# Patient Record
Sex: Male | Born: 1955 | Race: Black or African American | Hispanic: No | Marital: Married | Smoking: Never smoker
Health system: Southern US, Community
[De-identification: ages and names within clinical notes are randomized; demographics above are authoritative.]

## PROBLEM LIST (undated history)

## (undated) DIAGNOSIS — M1990 Unspecified osteoarthritis, unspecified site: Secondary | ICD-10-CM

## (undated) DIAGNOSIS — T7840XA Allergy, unspecified, initial encounter: Secondary | ICD-10-CM

## (undated) DIAGNOSIS — I10 Essential (primary) hypertension: Secondary | ICD-10-CM

## (undated) DIAGNOSIS — E7800 Pure hypercholesterolemia, unspecified: Secondary | ICD-10-CM

## (undated) HISTORY — DX: Unspecified osteoarthritis, unspecified site: M19.90

## (undated) HISTORY — PX: LIPOMA EXCISION: SHX5283

## (undated) HISTORY — DX: Allergy, unspecified, initial encounter: T78.40XA

---

## 1982-01-16 HISTORY — PX: KNEE ARTHROSCOPY: SHX127

## 2005-04-05 ENCOUNTER — Emergency Department (HOSPITAL_COMMUNITY): Admission: EM | Admit: 2005-04-05 | Discharge: 2005-04-05 | Payer: Self-pay | Admitting: *Deleted

## 2009-04-19 ENCOUNTER — Encounter (INDEPENDENT_AMBULATORY_CARE_PROVIDER_SITE_OTHER): Payer: Self-pay | Admitting: *Deleted

## 2010-02-17 NOTE — Letter (Signed)
Summary: Pre Visit No Show Letter  [facility]  [address] [place], [place] [address]   Phone: [phone]  Fax: [phone]        April 19, 2009 MRN: [id number]    Sean Keller [address] [place], [place]  [address]    Dear [name],   We have been unable to reach you by phone concerning the pre-procedure visit that you missed on 04/19/2009. For this reason,your procedure scheduled on 05/03/2009 has been cancelled. Our scheduling staff will gladly assist you with rescheduling your appointments at a more convenient time. Please call our office at [phone] between the hours of 8:00am and 5:00pm, press option #2 to reach an appointment scheduler. Please consider updating your contact numbers at this time so that we can reach you by phone in the future with schedule changes or results.    Thank you,    Ezra Sites RN [facility]

## 2010-11-29 ENCOUNTER — Other Ambulatory Visit: Payer: Self-pay | Admitting: Family Medicine

## 2010-11-29 DIAGNOSIS — R52 Pain, unspecified: Secondary | ICD-10-CM

## 2010-12-01 ENCOUNTER — Ambulatory Visit
Admission: RE | Admit: 2010-12-01 | Discharge: 2010-12-01 | Disposition: A | Discharge status: Home / Self Care | Payer: BC Managed Care – PPO | Source: Ambulatory Visit | Attending: Family Medicine | Admitting: Family Medicine

## 2010-12-01 DIAGNOSIS — R52 Pain, unspecified: Secondary | ICD-10-CM

## 2011-03-03 ENCOUNTER — Ambulatory Visit (HOSPITAL_COMMUNITY): Payer: BC Managed Care – PPO | Admitting: Radiology

## 2011-03-03 ENCOUNTER — Other Ambulatory Visit: Payer: Self-pay | Admitting: Family Medicine

## 2011-03-03 MED ORDER — PRAVASTATIN SODIUM 20 MG PO TABS: 20.0000 mg | ORAL_TABLET | Freq: Every day | ORAL | Status: DC

## 2011-06-15 ENCOUNTER — Other Ambulatory Visit: Payer: Self-pay | Admitting: Family Medicine

## 2011-07-17 DIAGNOSIS — Z0271 Encounter for disability determination: Secondary | ICD-10-CM

## 2011-08-08 ENCOUNTER — Encounter (HOSPITAL_COMMUNITY): Payer: Self-pay | Admitting: *Deleted

## 2011-08-08 ENCOUNTER — Emergency Department (HOSPITAL_COMMUNITY): Payer: Worker's Compensation

## 2011-08-08 ENCOUNTER — Emergency Department (HOSPITAL_COMMUNITY)
Admission: EM | Admit: 2011-08-08 | Discharge: 2011-08-08 | Disposition: A | Discharge status: Home / Self Care | Payer: Worker's Compensation | Attending: Emergency Medicine | Admitting: Emergency Medicine

## 2011-08-08 DIAGNOSIS — Y92410 Unspecified street and highway as the place of occurrence of the external cause: Secondary | ICD-10-CM | POA: Insufficient documentation

## 2011-08-08 DIAGNOSIS — R51 Headache: Secondary | ICD-10-CM | POA: Insufficient documentation

## 2011-08-08 DIAGNOSIS — M542 Cervicalgia: Secondary | ICD-10-CM | POA: Insufficient documentation

## 2011-08-08 DIAGNOSIS — S134XXA Sprain of ligaments of cervical spine, initial encounter: Secondary | ICD-10-CM

## 2011-08-08 DIAGNOSIS — E7800 Pure hypercholesterolemia, unspecified: Secondary | ICD-10-CM | POA: Insufficient documentation

## 2011-08-08 HISTORY — DX: Pure hypercholesterolemia, unspecified: E78.00

## 2011-08-08 MED ORDER — METHOCARBAMOL 500 MG PO TABS: 500.0000 mg | ORAL_TABLET | Freq: Four times a day (QID) | ORAL | Status: AC | PRN

## 2011-08-08 MED ORDER — NAPROXEN 375 MG PO TABS: 375.0000 mg | ORAL_TABLET | Freq: Two times a day (BID) | ORAL | Status: AC
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
## 2011-08-10 NOTE — ED Provider Notes (Signed)
Medical screening examination/treatment/procedure(s) were performed by non-physician practitioner and as supervising physician I was immediately available for consultation/collaboration.  [name], MD 08/10/11 1038 

## 2011-08-12 ENCOUNTER — Ambulatory Visit (INDEPENDENT_AMBULATORY_CARE_PROVIDER_SITE_OTHER): Payer: BC Managed Care – PPO | Admitting: Emergency Medicine

## 2011-08-12 VITALS — BP 136/82 | HR 73 | Temp 98.3°F | Resp 20 | Ht 69.0 in | Wt 230.0 lb

## 2011-08-12 DIAGNOSIS — S161XXA Strain of muscle, fascia and tendon at neck level, initial encounter: Secondary | ICD-10-CM

## 2011-08-12 DIAGNOSIS — S139XXA Sprain of joints and ligaments of unspecified parts of neck, initial encounter: Secondary | ICD-10-CM

## 2011-08-12 MED ORDER — HYDROCODONE-ACETAMINOPHEN 5-500 MG PO TABS: 1.0000 | ORAL_TABLET | ORAL | Status: AC | PRN

## 2011-08-12 NOTE — Patient Instructions (Addendum)
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
## 2011-11-29 ENCOUNTER — Other Ambulatory Visit: Payer: Self-pay | Admitting: Family Medicine

## 2012-01-08 ENCOUNTER — Ambulatory Visit (INDEPENDENT_AMBULATORY_CARE_PROVIDER_SITE_OTHER): Payer: BC Managed Care – PPO | Admitting: Emergency Medicine

## 2012-01-08 VITALS — BP 133/82 | HR 68 | Temp 98.0°F | Resp 17 | Ht 69.5 in | Wt 231.0 lb

## 2012-01-08 DIAGNOSIS — S335XXA Sprain of ligaments of lumbar spine, initial encounter: Secondary | ICD-10-CM

## 2012-01-08 DIAGNOSIS — Z0000 Encounter for general adult medical examination without abnormal findings: Secondary | ICD-10-CM

## 2012-01-08 DIAGNOSIS — Z23 Encounter for immunization: Secondary | ICD-10-CM

## 2012-01-08 LAB — TESTOSTERONE: Testosterone: 194.98 ng/dL — ABNORMAL LOW (ref 300–890)

## 2012-01-08 LAB — POCT CBC
Lymph, poc: 2.6 (ref 0.6–3.4)
MCH, POC: 28.1 pg (ref 27–31.2)
MCV: 91.7 fL (ref 80–97)
MPV: 8.4 fL (ref 0–99.8)
POC LYMPH PERCENT: 31.9 %L (ref 10–50)
Platelet Count, POC: 301 10*3/uL (ref 142–424)
RBC: 5.31 M/uL (ref 4.69–6.13)

## 2012-01-08 LAB — LIPID PANEL
Cholesterol: 138 mg/dL (ref 0–200)
HDL: 36 mg/dL — ABNORMAL LOW (ref >39)
LDL Cholesterol: 82 mg/dL (ref 0–99)
Total CHOL/HDL Ratio: 3.8 Ratio
Triglycerides: 99 mg/dL (ref <150)

## 2012-01-08 LAB — COMPREHENSIVE METABOLIC PANEL
Alkaline Phosphatase: 64 U/L (ref 39–117)
BUN: 14 mg/dL (ref 6–23)
Calcium: 9.6 mg/dL (ref 8.4–10.5)
Creat: 1.25 mg/dL (ref 0.50–1.35)
Sodium: 139 mEq/L (ref 135–145)
Total Protein: 7.2 g/dL (ref 6.0–8.3)

## 2012-01-08 LAB — POCT URINALYSIS DIPSTICK
Bilirubin, UA: NEGATIVE
Glucose, UA: NEGATIVE
Ketones, UA: NEGATIVE
Spec Grav, UA: 1.025

## 2012-01-08 LAB — PSA: PSA: 0.69 ng/mL (ref <=4.00)

## 2012-01-08 LAB — POCT UA - MICROSCOPIC ONLY
Casts, Ur, LPF, POC: NEGATIVE
Crystals, Ur, HPF, POC: NEGATIVE
Yeast, UA: NEGATIVE

## 2012-01-08 LAB — TSH: TSH: 1.012 u[IU]/mL (ref 0.350–4.500)

## 2012-01-08 MED ORDER — SILDENAFIL CITRATE 100 MG PO TABS: 100.0000 mg | ORAL_TABLET | Freq: Every day | ORAL | Status: DC | PRN

## 2012-01-08 MED ORDER — CYCLOBENZAPRINE HCL 10 MG PO TABS: 10.0000 mg | ORAL_TABLET | Freq: Three times a day (TID) | ORAL | Status: DC | PRN

## 2012-01-08 MED ORDER — NAPROXEN SODIUM 550 MG PO TABS: 550.0000 mg | ORAL_TABLET | Freq: Two times a day (BID) | ORAL | Status: AC

## 2012-01-08 NOTE — Progress Notes (Signed)
[facility] [address], [place] [address] [phone]  Date:  01/08/2012   Name:  Sean Keller   DOB:  07/30/1955   MRN:  [id number]  PCP:  Nilda Simmer, MD    Chief Complaint: Back Pain   History of Present Illness:  Sean Keller is a 56 y.o. very pleasant male patient who presents with the following:  For complete physical.  Current complaint of low back pain after pulling on a wrench a week ago  No radicular symptoms or weakness.  Requested a flu shot.  Treated for hyperlipidemia.  No adverse symptoms of treatment with statin.  Overdue for colonoscopy.    There is no problem list on file for this patient.   Past Medical History  Diagnosis Date  . Hypercholesteremia   . Arthritis     History reviewed. No pertinent past surgical history.  History  Substance Use Topics  . Smoking status: Never Smoker   . Smokeless tobacco: Not on file  . Alcohol Use: No    History reviewed. No pertinent family history.  No Known Allergies  Medication list has been reviewed and updated.  Current Outpatient Prescriptions on File Prior to Visit  Medication Sig Dispense Refill  . fluticasone (FLONASE) 50 MCG/ACT nasal spray Place 2 sprays into the nose daily.      Marland Kitchen ketoconazole (NIZORAL) 2 % shampoo       . Multiple Vitamin (MULTIVITAMINS PO) Take by mouth.      . pravastatin (PRAVACHOL) 20 MG tablet Take 1 tablet (20 mg total) by mouth daily. Needs office visit and labs  30 tablet  0  . naproxen (NAPROSYN) 375 MG tablet Take 1 tablet (375 mg total) by mouth 2 (two) times daily. Take with food  20 tablet  0    Review of Systems: As per HPI, otherwise negative.    Physical Examination: Filed Vitals:   01/08/12 1102  BP: 133/82  Pulse: 68  Temp: 98 F (36.7 C)  Resp: 17   Filed Vitals:   01/08/12 1102  Height: 5' 9.5" (1.765 m)  Weight: 231 lb (104.781 kg)   Body mass index is 33.62 kg/(m^2). Ideal Body Weight: Weight in (lb) to have  BMI = 25: 171.4   GEN: WDWN, NAD, Non-toxic, A & O x 3 HEENT: Atraumatic, Normocephalic. Neck supple. No masses, No LAD. Ears and Nose: No external deformity. CV: RRR, No M/G/R. No JVD. No thrill. No extra heart sounds. PULM: CTA B, no wheezes, crackles, rhonchi. No retractions. No resp. distress. No accessory muscle use. ABD: S, NT, ND, +BS. No rebound. No HSM. Back:  Tender lumbar paravertebral muscles EXTR: No c/c/e NEURO Normal gait.  PSYCH: Normally interactive. Conversant. Not depressed or anxious appearing.  Calm demeanor.  RECTAL Deferred as he is scheduled for colonscopy  Assessment and Plan: Wellness examination Hyperlipidemia Lumbar strain   Anaprox  & flexeril Flu shot  Carmelina Dane, MD

## 2012-01-09 ENCOUNTER — Other Ambulatory Visit: Payer: Self-pay | Admitting: Physician Assistant

## 2012-01-09 LAB — VITAMIN D 25 HYDROXY (VIT D DEFICIENCY, FRACTURES): Vit D, 25-Hydroxy: 46 ng/mL (ref 30–89)

## 2012-01-09 MED ORDER — PRAVASTATIN SODIUM 20 MG PO TABS: 20.0000 mg | ORAL_TABLET | Freq: Every day | ORAL | Status: DC

## 2012-01-11 MED ORDER — TESTOSTERONE 20.25 MG/1.25GM (1.62%) TD GEL: 4.0000 | Freq: Every day | TRANSDERMAL | Status: DC

## 2012-01-12 ENCOUNTER — Telehealth: Payer: Self-pay

## 2012-01-12 NOTE — Telephone Encounter (Signed)
Came to pick up rx but no record available. Went over result this morning with someone and he was notified that his rx was going to be called in by one of our dr. Danae Chen you!

## 2012-01-12 NOTE — Telephone Encounter (Signed)
Called patient, what Rx does he need? He is asking for testosterone gel, it was sent to mail order. He has been advised

## 2012-01-18 ENCOUNTER — Telehealth: Payer: Self-pay

## 2012-01-18 MED ORDER — TESTOSTERONE 20.25 MG/1.25GM (1.62%) TD GEL: 4.0000 | Freq: Every day | TRANSDERMAL | Status: DC

## 2012-01-18 NOTE — Telephone Encounter (Signed)
[facility] [facility] and notified him that it was done. Advised [facility] to check w[facility] before p/up bc often ins requires a prior auth and pharmacy should fax [place] a request w/info to contact ins co. Pt agreed.

## 2012-01-18 NOTE — Telephone Encounter (Signed)
PT SAW ANDERSON AND HE WAS TO SEND IN SOME TESTOSTERONE TO [facility] FOR HIM.  THEY TOLD HIM THEY NEVER RECEIVED A REQUEST FROM [place].  NOW HE WOULD LIKE FOR [place] TO SEND [facility] IN TO [facility] [address] [place].  [phone]

## 2012-01-23 NOTE — Progress Notes (Signed)
Completed prior auth form for pt's Androgel 1.62% gel pump and faxed to [facility] on 01/19/12. Received approval today for life of policy and faxed approval notice to pharmacy. Notified pt.

## 2012-02-10 ENCOUNTER — Other Ambulatory Visit: Payer: Self-pay | Admitting: Family Medicine

## 2012-02-12 NOTE — Telephone Encounter (Signed)
Please pull paper chart.  

## 2012-02-16 ENCOUNTER — Other Ambulatory Visit: Payer: Self-pay | Admitting: Radiology

## 2012-02-16 ENCOUNTER — Other Ambulatory Visit: Payer: Self-pay | Admitting: Physician Assistant

## 2012-02-16 MED ORDER — PRAVASTATIN SODIUM 20 MG PO TABS: 20.0000 mg | ORAL_TABLET | Freq: Every day | ORAL | Status: DC

## 2012-05-28 ENCOUNTER — Other Ambulatory Visit: Payer: Self-pay | Admitting: Physician Assistant

## 2012-05-28 NOTE — Telephone Encounter (Signed)
Needs OV, labs, 2nd notice

## 2012-07-11 ENCOUNTER — Other Ambulatory Visit: Payer: Self-pay | Admitting: Physician Assistant

## 2012-07-30 ENCOUNTER — Other Ambulatory Visit: Payer: Self-pay | Admitting: Physician Assistant

## 2012-08-24 ENCOUNTER — Other Ambulatory Visit: Payer: Self-pay | Admitting: Physician Assistant

## 2013-01-12 ENCOUNTER — Other Ambulatory Visit: Payer: Self-pay | Admitting: Emergency Medicine

## 2013-01-13 ENCOUNTER — Ambulatory Visit (INDEPENDENT_AMBULATORY_CARE_PROVIDER_SITE_OTHER): Payer: BC Managed Care – PPO | Admitting: Physician Assistant

## 2013-01-13 VITALS — BP 138/88 | HR 69 | Temp 98.5°F | Resp 18 | Ht 69.5 in | Wt 235.0 lb

## 2013-01-13 DIAGNOSIS — Z0000 Encounter for general adult medical examination without abnormal findings: Secondary | ICD-10-CM

## 2013-01-13 DIAGNOSIS — E785 Hyperlipidemia, unspecified: Secondary | ICD-10-CM

## 2013-01-13 DIAGNOSIS — L210 Seborrhea capitis: Secondary | ICD-10-CM

## 2013-01-13 DIAGNOSIS — E291 Testicular hypofunction: Secondary | ICD-10-CM

## 2013-01-13 DIAGNOSIS — J309 Allergic rhinitis, unspecified: Secondary | ICD-10-CM

## 2013-01-13 DIAGNOSIS — Z125 Encounter for screening for malignant neoplasm of prostate: Secondary | ICD-10-CM

## 2013-01-13 DIAGNOSIS — R42 Dizziness and giddiness: Secondary | ICD-10-CM

## 2013-01-13 DIAGNOSIS — R7989 Other specified abnormal findings of blood chemistry: Secondary | ICD-10-CM | POA: Insufficient documentation

## 2013-01-13 DIAGNOSIS — Z1211 Encounter for screening for malignant neoplasm of colon: Secondary | ICD-10-CM

## 2013-01-13 DIAGNOSIS — Z1159 Encounter for screening for other viral diseases: Secondary | ICD-10-CM

## 2013-01-13 DIAGNOSIS — Z23 Encounter for immunization: Secondary | ICD-10-CM

## 2013-01-13 LAB — TSH: TSH: 0.914 u[IU]/mL (ref 0.350–4.500)

## 2013-01-13 LAB — POCT URINALYSIS DIPSTICK
Bilirubin, UA: NEGATIVE
Leukocytes, UA: NEGATIVE
Nitrite, UA: NEGATIVE
Spec Grav, UA: 1.02
Urobilinogen, UA: 0.2
pH, UA: 6

## 2013-01-13 LAB — COMPREHENSIVE METABOLIC PANEL
ALT: 22 U/L (ref 0–53)
AST: 18 U/L (ref 0–37)
Albumin: 4.5 g/dL (ref 3.5–5.2)
Alkaline Phosphatase: 62 U/L (ref 39–117)
BUN: 14 mg/dL (ref 6–23)
CO2: 27 mEq/L (ref 19–32)
Calcium: 9.6 mg/dL (ref 8.4–10.5)
Chloride: 102 mEq/L (ref 96–112)
Creat: 1.24 mg/dL (ref 0.50–1.35)
Glucose, Bld: 99 mg/dL (ref 70–99)
Potassium: 4.2 mEq/L (ref 3.5–5.3)
Sodium: 137 mEq/L (ref 135–145)
Total Bilirubin: 0.5 mg/dL (ref 0.3–1.2)

## 2013-01-13 LAB — POCT CBC
Granulocyte percent: 67.3 %G (ref 37–80)
HCT, POC: 51.1 % (ref 43.5–53.7)
Lymph, poc: 2 (ref 0.6–3.4)
MCHC: 32.1 g/dL (ref 31.8–35.4)
MPV: 9.1 fL (ref 0–99.8)
POC Granulocyte: 5.2 (ref 2–6.9)
POC LYMPH PERCENT: 25.7 %L (ref 10–50)
POC MID %: 7 %M (ref 0–12)
RDW, POC: 14 %
WBC: 7.8 10*3/uL (ref 4.6–10.2)

## 2013-01-13 LAB — LIPID PANEL
Cholesterol: 222 mg/dL — ABNORMAL HIGH (ref 0–200)
HDL: 49 mg/dL (ref >39)
LDL Cholesterol: 149 mg/dL — ABNORMAL HIGH (ref 0–99)
Triglycerides: 120 mg/dL (ref <150)
VLDL: 24 mg/dL (ref 0–40)

## 2013-01-13 LAB — POCT UA - MICROSCOPIC ONLY: Mucus, UA: POSITIVE

## 2013-01-13 LAB — GLUCOSE, POCT (MANUAL RESULT ENTRY): POC Glucose: 93 mg/dl (ref 70–99)

## 2013-01-13 LAB — HEPATITIS C ANTIBODY: HCV Ab: NEGATIVE

## 2013-01-13 MED ORDER — PRAVASTATIN SODIUM 20 MG PO TABS: 20.0000 mg | ORAL_TABLET | Freq: Every day | ORAL | Status: DC

## 2013-01-13 MED ORDER — FLUTICASONE PROPIONATE 50 MCG/ACT NA SUSP: NASAL | Status: DC

## 2013-01-13 MED ORDER — KETOCONAZOLE 2 % EX SHAM: MEDICATED_SHAMPOO | CUTANEOUS | Status: DC

## 2013-01-13 NOTE — Patient Instructions (Signed)

## 2013-01-13 NOTE — Progress Notes (Signed)
Subjective:    Patient ID: Sean Keller, male    DOB: 1955/12/09, 57 y.o.   MRN: [id number]  PCP: Nilda Simmer, MD  Chief Complaint  Patient presents with  . Annual Exam  . Dizziness    x1 day  . Irregular Heart Beat     Active Ambulatory Problems    Diagnosis Date Noted  . Hyperlipidemia 01/13/2013  . Low testosterone 01/13/2013  . Seborrhea capitis 01/13/2013   Resolved Ambulatory Problems    Diagnosis Date Noted  . No Resolved Ambulatory Problems   Past Medical History  Diagnosis Date  . Hypercholesteremia   . Arthritis   . Allergy     Past Surgical History  Procedure Laterality Date  . Lipoma excision Right     shoulder  . Knee arthroscopy Right 1984    No Known Allergies  Prior to Admission medications   Medication Sig Start Date End Date Taking? Authorizing Provider  fluticasone (FLONASE) 50 MCG/ACT nasal spray USE TWO SPRAYS IN EACH NOSTRIL EVERY DAY AS NEEDED 01/13/13  Yes Agam Tuohy S Fraidy Mccarrick, PA-C  Multiple Vitamin (MULTIVITAMINS PO) Take by mouth.   Yes Historical Provider, MD  ANDROGEL PUMP 20.25 MG/ACT (1.62%) GEL APPLY 4 PUMPS TO SKIN DAILY 01/12/13   Cruzito Standre S Trevis Eden, PA-C  ketoconazole (NIZORAL) 2 % shampoo Apply topically 2 (two) times a week. 01/13/13   Jamisyn Langer S Madolin Twaddle, PA-C  pravastatin (PRAVACHOL) 20 MG tablet Take 1 tablet (20 mg total) by mouth daily. 01/13/13   Amish Mintzer Tessa Lerner, PA-C  Testosterone 20.25 MG/1.25GM (1.62%) GEL Place 4 Squirts onto the skin daily. 01/18/12   Phillips Odor, MD  VIAGRA 100 MG tablet TAKE 1 TABLET BY MOUTH EVERY DAY AS NEEDED 01/12/13   Fernande Bras, PA-C    History   Social History  . Marital Status: Married    Spouse Name: N/A    Number of Children: N/A  . Years of Education: N/A   Social History Main Topics  . Smoking status: Never Smoker   . Smokeless tobacco: None  . Alcohol Use: No  . Drug Use: No  . Sexual Activity: Yes    Birth Control/ Protection: None   Other Topics Concern  . None    Social History Narrative  . None    family history is not on file. indicated that his mother is alive. He indicated that his father is deceased.   HPI He has been off his medications since 08/2012, believing he was out of refills.  Dizzy spell this morning, beginning about 6 am when he awoke.  Nothing to eat since 11 pm. "hears" his heartbeat x several months. Not irregular, just aware of it.  Had an episode about 8-9 months ago when it was too fast x 20-30 minutes.  Resolved and has had no recurrence. Sometimes ("not often") has brief indigestion, late in the evening or in the middle of the night.  "It scares me into getting up and taking an aspirin." No other chest pain.  Review of Systems As above.  Complete ROS is otherwise NEGATIVE.    Objective:   Physical Exam  Constitutional: He is oriented to person, place, and time. Vital signs are normal. He appears well-developed and well-nourished. He is active and cooperative.  Non-toxic appearance. He does not have a sickly appearance. He does not appear ill. No distress.  HENT:  Head: Normocephalic and atraumatic.  Right Ear: Hearing, tympanic membrane, external ear and ear canal normal.  Left Ear: Hearing, tympanic  membrane, external ear and ear canal normal.  Nose: Nose normal.  Mouth/Throat: Uvula is midline, oropharynx is clear and moist and mucous membranes are normal. He does not have dentures. No oral lesions. No trismus in the jaw. Normal dentition. No dental abscesses, uvula swelling, lacerations or dental caries.  Eyes: Conjunctivae, EOM and lids are normal. Pupils are equal, round, and reactive to light. Right eye exhibits no discharge. Left eye exhibits no discharge. No scleral icterus.  Fundoscopic exam:      The right eye shows no arteriolar narrowing, no AV nicking, no exudate, no hemorrhage and no papilledema.       The left eye shows no arteriolar narrowing, no AV nicking, no exudate, no hemorrhage and no papilledema.    Neck: Normal range of motion, full passive range of motion without pain and phonation normal. Neck supple. No spinous process tenderness and no muscular tenderness present. No rigidity. No tracheal deviation, no edema, no erythema and normal range of motion present. No thyromegaly present.  Cardiovascular: Normal rate, regular rhythm, S1 normal, S2 normal, normal heart sounds, intact distal pulses and normal pulses.  Exam reveals no gallop and no friction rub.   No murmur heard. Pulmonary/Chest: Effort normal and breath sounds normal. No respiratory distress. He has no wheezes. He has no rales.  Abdominal: Soft. Normal appearance and bowel sounds are normal. He exhibits no distension and no mass. There is no hepatosplenomegaly. There is no tenderness. There is no rebound and no guarding. No hernia. Hernia confirmed negative in the right inguinal area and confirmed negative in the left inguinal area.  Genitourinary: Rectum normal, testes normal and penis normal. Guaiac negative stool. Prostate is enlarged. Prostate is not tender. Circumcised. No phimosis, paraphimosis, hypospadias, penile erythema or penile tenderness. No discharge found.  Musculoskeletal: Normal range of motion. He exhibits no edema and no tenderness.       Right shoulder: Normal.       Left shoulder: Normal.       Right elbow: Normal.      Left elbow: Normal.       Right wrist: Normal.       Left wrist: Normal.       Right hip: Normal.       Left hip: Normal.       Right knee: Normal.       Left knee: Normal.       Right ankle: Normal. Achilles tendon normal.       Left ankle: Normal. Achilles tendon normal.       Cervical back: Normal. He exhibits normal range of motion, no tenderness, no bony tenderness, no swelling, no edema, no deformity, no laceration, no pain, no spasm and normal pulse.       Thoracic back: Normal.       Lumbar back: Normal.       Right upper arm: Normal.       Left upper arm: Normal.       Right  forearm: Normal.       Left forearm: Normal.       Right hand: Normal.       Left hand: Normal.       Right upper leg: Normal.       Left upper leg: Normal.       Right lower leg: Normal.       Left lower leg: Normal.       Right foot: Normal.       Left foot:  Normal.  Lymphadenopathy:       Head (right side): No submental, no submandibular, no tonsillar, no preauricular, no posterior auricular and no occipital adenopathy present.       Head (left side): No submental, no submandibular, no tonsillar, no preauricular, no posterior auricular and no occipital adenopathy present.    He has no cervical adenopathy.       Right: No inguinal and no supraclavicular adenopathy present.       Left: No inguinal and no supraclavicular adenopathy present.  Neurological: He is alert and oriented to person, place, and time. He has normal strength and normal reflexes. He displays no tremor. No cranial nerve deficit. He exhibits normal muscle tone. Coordination and gait normal.  Skin: Skin is warm, dry and intact. No abrasion, no ecchymosis, no laceration, no lesion and no rash noted. He is not diaphoretic. No cyanosis or erythema. No pallor. Nails show no clubbing.  Psychiatric: He has a normal mood and affect. His speech is normal and behavior is normal. Judgment and thought content normal. Cognition and memory are normal.      EKG: NSR. Reviewed with Dr. Patsy Lager.  Results for orders placed in visit on 01/13/13  POCT CBC      Result Value Range   WBC 7.8  4.6 - 10.2 K/uL   Lymph, poc 2.0  0.6 - 3.4   POC LYMPH PERCENT 25.7  10 - 50 %L   MID (cbc) 0.5  0 - 0.9   POC MID % 7.0  0 - 12 %M   POC Granulocyte 5.2  2 - 6.9   Granulocyte percent 67.3  37 - 80 %G   RBC 5.57  4.69 - 6.13 M/uL   Hemoglobin 16.4  14.1 - 18.1 g/dL   HCT, POC 40.9  81.1 - 53.7 %   MCV 91.7  80 - 97 fL   MCH, POC 29.4  27 - 31.2 pg   MCHC 32.1  31.8 - 35.4 g/dL   RDW, POC 91.4     Platelet Count, POC 257  142 - 424 K/uL    MPV 9.1  0 - 99.8 fL  GLUCOSE, POCT (MANUAL RESULT ENTRY)      Result Value Range   POC Glucose 93  70 - 99 mg/dl  POCT UA - MICROSCOPIC ONLY      Result Value Range   WBC, Ur, HPF, POC 0-2     RBC, urine, microscopic 0-2     Bacteria, U Microscopic trace     Mucus, UA positive     Epithelial cells, urine per micros 0-1     Crystals, Ur, HPF, POC neg     Casts, Ur, LPF, POC neg     Yeast, UA neg     Sperm, UA positive    POCT URINALYSIS DIPSTICK      Result Value Range   Color, UA yellow     Clarity, UA clear     Glucose, UA neg     Bilirubin, UA neg     Ketones, UA neg     Spec Grav, UA 1.020     Blood, UA trace-intact     pH, UA 6.0     Protein, UA trace     Urobilinogen, UA 0.2     Nitrite, UA neg     Leukocytes, UA Negative    IFOBT (OCCULT BLOOD)      Result Value Range   IFOBT Negative  Assessment & Plan:  1. Routine general medical examination at a health care facility Age appropriate anticipatory guidance provided. - POCT CBC - POCT UA - Microscopic Only - POCT urinalysis dipstick  2. Dizziness Likely due to transient hypoglycemia, having fasted for 12+ hours - POCT glucose (manual entry) - Comprehensive metabolic panel - TSH - EKG 12-Lead  3. Hyperlipidemia Restart pravastatin. RTC 3-6 months. - Comprehensive metabolic panel - Lipid panel - pravastatin (PRAVACHOL) 20 MG tablet; Take 1 tablet (20 mg total) by mouth daily.  Dispense: 90 tablet; Refill: 1  4. Low testosterone Restart testosterone therapy. - TSH - Testosterone  5. Screening for colon cancer - Ambulatory referral to Gastroenterology - IFOBT POC (occult bld, rslt in office)  6. Screening for prostate cancer - PSA  7. Need for influenza vaccination - Flu Vaccine QUAD 36+ mos PF IM (Fluarix)  8. Need for hepatitis C screening test - Hepatitis C antibody  9. Allergic rhinitis Continue current treatment - fluticasone (FLONASE) 50 MCG/ACT nasal spray; USE TWO SPRAYS IN  EACH NOSTRIL EVERY DAY AS NEEDED  Dispense: 16 g; Refill: 12  10. Seborrhea capitis - ketoconazole (NIZORAL) 2 % shampoo; Apply topically 2 (two) times a week.  Dispense: 120 mL; Refill: 5   Fernande Bras, PA-C Physician Assistant-Certified [facility]

## 2013-01-13 NOTE — Telephone Encounter (Signed)
Chelle, you just saw this pt for check-up today. Please review.

## 2013-01-14 LAB — TESTOSTERONE: Testosterone: 222 ng/dL — ABNORMAL LOW (ref 300–890)

## 2013-01-17 ENCOUNTER — Encounter: Payer: Self-pay | Admitting: Physician Assistant

## 2013-03-21 ENCOUNTER — Encounter: Payer: Self-pay | Admitting: Gastroenterology

## 2013-03-24 ENCOUNTER — Other Ambulatory Visit: Payer: Self-pay | Admitting: Physician Assistant

## 2013-03-26 NOTE — Telephone Encounter (Signed)
Called in.

## 2013-05-12 ENCOUNTER — Encounter: Payer: Self-pay | Admitting: Gastroenterology

## 2013-05-18 ENCOUNTER — Other Ambulatory Visit: Payer: Self-pay | Admitting: Physician Assistant

## 2013-07-29 ENCOUNTER — Other Ambulatory Visit: Payer: Self-pay | Admitting: Physician Assistant

## 2013-07-29 NOTE — Telephone Encounter (Signed)
Pt notified that rx is ready for p/u 

## 2013-08-12 ENCOUNTER — Ambulatory Visit (INDEPENDENT_AMBULATORY_CARE_PROVIDER_SITE_OTHER): Payer: BC Managed Care – PPO

## 2013-08-12 ENCOUNTER — Ambulatory Visit (INDEPENDENT_AMBULATORY_CARE_PROVIDER_SITE_OTHER): Payer: BC Managed Care – PPO | Admitting: Internal Medicine

## 2013-08-12 VITALS — BP 136/82 | HR 65 | Temp 97.5°F | Resp 16 | Ht 69.5 in | Wt 226.6 lb

## 2013-08-12 DIAGNOSIS — E291 Testicular hypofunction: Secondary | ICD-10-CM

## 2013-08-12 DIAGNOSIS — M25511 Pain in right shoulder: Secondary | ICD-10-CM

## 2013-08-12 DIAGNOSIS — E785 Hyperlipidemia, unspecified: Secondary | ICD-10-CM

## 2013-08-12 DIAGNOSIS — M25519 Pain in unspecified shoulder: Secondary | ICD-10-CM

## 2013-08-12 DIAGNOSIS — IMO0002 Reserved for concepts with insufficient information to code with codable children: Secondary | ICD-10-CM

## 2013-08-12 DIAGNOSIS — Z79899 Other long term (current) drug therapy: Secondary | ICD-10-CM

## 2013-08-12 DIAGNOSIS — S46911A Strain of unspecified muscle, fascia and tendon at shoulder and upper arm level, right arm, initial encounter: Secondary | ICD-10-CM

## 2013-08-12 DIAGNOSIS — R7989 Other specified abnormal findings of blood chemistry: Secondary | ICD-10-CM

## 2013-08-12 LAB — COMPLETE METABOLIC PANEL WITH GFR
ALK PHOS: 58 U/L (ref 39–117)
ALT: 15 U/L (ref 0–53)
AST: 19 U/L (ref 0–37)
Albumin: 4.3 g/dL (ref 3.5–5.2)
BUN: 11 mg/dL (ref 6–23)
CO2: 27 mEq/L (ref 19–32)
CREATININE: 1.2 mg/dL (ref 0.50–1.35)
Calcium: 9.2 mg/dL (ref 8.4–10.5)
Chloride: 106 mEq/L (ref 96–112)
GFR, Est African American: 78 mL/min
GFR, Est Non African American: 67 mL/min
Glucose, Bld: 104 mg/dL — ABNORMAL HIGH (ref 70–99)
Potassium: 4.4 mEq/L (ref 3.5–5.3)
Sodium: 139 mEq/L (ref 135–145)
Total Bilirubin: 0.6 mg/dL (ref 0.2–1.2)
Total Protein: 7 g/dL (ref 6.0–8.3)

## 2013-08-12 LAB — LIPID PANEL
Cholesterol: 152 mg/dL (ref 0–200)
HDL: 41 mg/dL (ref >39)
LDL CALC: 98 mg/dL (ref 0–99)
Total CHOL/HDL Ratio: 3.7 Ratio
Triglycerides: 64 mg/dL (ref <150)
VLDL: 13 mg/dL (ref 0–40)

## 2013-08-12 LAB — GLUCOSE, POCT (MANUAL RESULT ENTRY): POC GLUCOSE: 118 mg/dL — AB (ref 70–99)

## 2013-08-12 LAB — CBC
HEMATOCRIT: 44.1 % (ref 39.0–52.0)
Hemoglobin: 15.9 g/dL (ref 13.0–17.0)
MCH: 29.7 pg (ref 26.0–34.0)
MCHC: 36.1 g/dL — ABNORMAL HIGH (ref 30.0–36.0)
MCV: 82.4 fL (ref 78.0–100.0)
Platelets: 278 10*3/uL (ref 150–400)
RBC: 5.35 MIL/uL (ref 4.22–5.81)
RDW: 14.6 % (ref 11.5–15.5)
WBC: 6.6 10*3/uL (ref 4.0–10.5)

## 2013-08-12 LAB — POCT GLYCOSYLATED HEMOGLOBIN (HGB A1C): HEMOGLOBIN A1C: 5.7

## 2013-08-12 LAB — POCT SEDIMENTATION RATE: POCT SED RATE: 6 mm/hr (ref 0–22)

## 2013-08-12 MED ORDER — IBUPROFEN 600 MG PO TABS: 600.0000 mg | ORAL_TABLET | Freq: Three times a day (TID) | ORAL | Status: DC | PRN

## 2013-08-12 NOTE — Patient Instructions (Signed)

## 2013-08-12 NOTE — Progress Notes (Signed)
   Subjective:    Patient ID: Sean Keller, male    DOB: 04/27/1955, 58 y.o.   MRN: [id number]  HPI Pt thinks he has pulled his shoulder while lifting something two months ago. It got better, but now experiencing discomfort again. It's not aching now, but he can't raise his arm very high.   Also presents with some lab orders from Dr Hazle Quantigby. He had diplopia in march, and Dr Hazle Quantigby gave him a lab order to check some things. The diplopia resolved on its own.   Pt also notes slight tingling in his right fingers, just the tips. Mostly the 1st-3rd digits.He also needs f/up of his hyperlipidemia and low testosterone. [facility]. He is doing well and working every day.   Review of Systems     Objective:   Physical Exam  Constitutional: He is oriented to person, place, and time. He appears well-developed and well-nourished.  HENT:  Head: Normocephalic.  Eyes: EOM are normal. Pupils are equal, round, and reactive to light.  Neck: Normal range of motion. Neck supple. No thyromegaly present.  Cardiovascular: Normal rate, regular rhythm and normal heart sounds.   Pulmonary/Chest: Effort normal and breath sounds normal.  Abdominal: Soft. Bowel sounds are normal.  Musculoskeletal: He exhibits tenderness.       Right shoulder: He exhibits decreased range of motion, tenderness and pain. He exhibits no bony tenderness, no swelling, no effusion, no crepitus, no spasm, normal pulse and normal strength.  Lymphadenopathy:    He has no cervical adenopathy.  Neurological: He is alert and oriented to person, place, and time. He exhibits normal muscle tone. Coordination normal.  Psychiatric: He has a normal mood and affect. His behavior is normal. Thought content normal.   [facility] reading (PRIMARY) by  Dr Perrin MalteseGuest normal shoulder xr  Results for orders placed in visit on 08/12/13  GLUCOSE, POCT (MANUAL RESULT ENTRY)      Result Value Ref Range   POC Glucose 118 (*)  70 - 99 mg/dl  POCT GLYCOSYLATED HEMOGLOBIN (HGB A1C)      Result Value Ref Range   Hemoglobin A1C 5.7              Assessment & Plan:  Right shoulder strain RICE/Rehab/Motrin  Needs close f/up for prostate cancer while on testosterone

## 2013-08-13 ENCOUNTER — Encounter: Payer: Self-pay | Admitting: *Deleted

## 2013-08-13 LAB — PSA: PSA: 0.91 ng/mL (ref <=4.00)

## 2013-08-28 ENCOUNTER — Other Ambulatory Visit: Payer: Self-pay | Admitting: Physician Assistant

## 2014-02-21 ENCOUNTER — Other Ambulatory Visit: Payer: Self-pay | Admitting: Internal Medicine

## 2014-03-21 ENCOUNTER — Other Ambulatory Visit: Payer: Self-pay | Admitting: Physician Assistant

## 2014-04-25 ENCOUNTER — Other Ambulatory Visit: Payer: Self-pay | Admitting: Physician Assistant

## 2014-05-11 ENCOUNTER — Ambulatory Visit (INDEPENDENT_AMBULATORY_CARE_PROVIDER_SITE_OTHER): Payer: BLUE CROSS/BLUE SHIELD

## 2014-05-11 ENCOUNTER — Ambulatory Visit (INDEPENDENT_AMBULATORY_CARE_PROVIDER_SITE_OTHER): Payer: BLUE CROSS/BLUE SHIELD | Admitting: Emergency Medicine

## 2014-05-11 DIAGNOSIS — J309 Allergic rhinitis, unspecified: Secondary | ICD-10-CM

## 2014-05-11 DIAGNOSIS — M542 Cervicalgia: Secondary | ICD-10-CM

## 2014-05-11 DIAGNOSIS — E785 Hyperlipidemia, unspecified: Secondary | ICD-10-CM

## 2014-05-11 LAB — LIPID PANEL
Cholesterol: 159 mg/dL (ref 0–200)
HDL: 31 mg/dL — ABNORMAL LOW (ref >=40)
LDL Cholesterol: 104 mg/dL — ABNORMAL HIGH (ref 0–99)
TRIGLYCERIDES: 122 mg/dL (ref <150)
Total CHOL/HDL Ratio: 5.1 Ratio
VLDL: 24 mg/dL (ref 0–40)

## 2014-05-11 MED ORDER — PRAVASTATIN SODIUM 20 MG PO TABS: ORAL_TABLET | ORAL | Status: DC

## 2014-05-11 MED ORDER — FLUTICASONE PROPIONATE 50 MCG/ACT NA SUSP: NASAL | Status: AC

## 2014-05-11 MED ORDER — MELOXICAM 15 MG PO TABS: 15.0000 mg | ORAL_TABLET | Freq: Every day | ORAL | Status: DC
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
## 2014-05-11 NOTE — Patient Instructions (Signed)

## 2014-05-17 ENCOUNTER — Other Ambulatory Visit: Payer: Self-pay | Admitting: Physician Assistant

## 2014-08-03 ENCOUNTER — Other Ambulatory Visit: Payer: Self-pay | Admitting: Physician Assistant

## 2014-08-04 NOTE — Telephone Encounter (Signed)
Reviewed with Dr. Dareen PianoAnderson, refill provided. I recommend he come back for annual physical exam. His last one was in 2014.

## 2014-08-05 ENCOUNTER — Emergency Department (HOSPITAL_COMMUNITY)
Admission: EM | Admit: 2014-08-05 | Discharge: 2014-08-05 | Disposition: A | Discharge status: Home / Self Care | Payer: Self-pay | Attending: Emergency Medicine | Admitting: Emergency Medicine

## 2014-08-05 ENCOUNTER — Encounter (HOSPITAL_COMMUNITY): Payer: Self-pay | Admitting: *Deleted

## 2014-08-05 DIAGNOSIS — M1990 Unspecified osteoarthritis, unspecified site: Secondary | ICD-10-CM | POA: Insufficient documentation

## 2014-08-05 DIAGNOSIS — Y92410 Unspecified street and highway as the place of occurrence of the external cause: Secondary | ICD-10-CM | POA: Insufficient documentation

## 2014-08-05 DIAGNOSIS — Y998 Other external cause status: Secondary | ICD-10-CM | POA: Insufficient documentation

## 2014-08-05 DIAGNOSIS — Z791 Long term (current) use of non-steroidal anti-inflammatories (NSAID): Secondary | ICD-10-CM | POA: Insufficient documentation

## 2014-08-05 DIAGNOSIS — Z041 Encounter for examination and observation following transport accident: Secondary | ICD-10-CM | POA: Insufficient documentation

## 2014-08-05 DIAGNOSIS — Y9389 Activity, other specified: Secondary | ICD-10-CM | POA: Insufficient documentation

## 2014-08-05 DIAGNOSIS — E780 Pure hypercholesterolemia: Secondary | ICD-10-CM | POA: Insufficient documentation
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
## 2015-05-04 ENCOUNTER — Encounter (HOSPITAL_COMMUNITY): Payer: Self-pay | Admitting: Emergency Medicine

## 2015-05-04 ENCOUNTER — Emergency Department (HOSPITAL_COMMUNITY)
Admission: EM | Admit: 2015-05-04 | Discharge: 2015-05-04 | Disposition: A | Discharge status: Home / Self Care | Payer: BLUE CROSS/BLUE SHIELD | Attending: Emergency Medicine | Admitting: Emergency Medicine

## 2015-05-04 DIAGNOSIS — S0592XA Unspecified injury of left eye and orbit, initial encounter: Secondary | ICD-10-CM | POA: Diagnosis present

## 2015-05-04 DIAGNOSIS — M1990 Unspecified osteoarthritis, unspecified site: Secondary | ICD-10-CM | POA: Insufficient documentation

## 2015-05-04 DIAGNOSIS — X58XXXA Exposure to other specified factors, initial encounter: Secondary | ICD-10-CM | POA: Insufficient documentation

## 2015-05-04 DIAGNOSIS — Z791 Long term (current) use of non-steroidal anti-inflammatories (NSAID): Secondary | ICD-10-CM | POA: Diagnosis not present

## 2015-05-04 DIAGNOSIS — Y9389 Activity, other specified: Secondary | ICD-10-CM | POA: Insufficient documentation

## 2015-05-04 DIAGNOSIS — Y92410 Unspecified street and highway as the place of occurrence of the external cause: Secondary | ICD-10-CM | POA: Insufficient documentation

## 2015-05-04 DIAGNOSIS — Z79899 Other long term (current) drug therapy: Secondary | ICD-10-CM | POA: Insufficient documentation

## 2015-05-04 DIAGNOSIS — E7800 Pure hypercholesterolemia, unspecified: Secondary | ICD-10-CM | POA: Insufficient documentation

## 2015-05-04 DIAGNOSIS — S0502XA Injury of conjunctiva and corneal abrasion without foreign body, left eye, initial encounter: Secondary | ICD-10-CM | POA: Diagnosis not present

## 2015-05-04 DIAGNOSIS — Y990 Civilian activity done for income or pay: Secondary | ICD-10-CM | POA: Diagnosis not present

## 2015-05-04 MED ORDER — ERYTHROMYCIN 5 MG/GM OP OINT: 1.0000 "application " | TOPICAL_OINTMENT | Freq: Four times a day (QID) | OPHTHALMIC | Status: DC

## 2015-05-04 MED ORDER — TETRACAINE HCL 0.5 % OP SOLN
1.0000 [drp] | Freq: Once | OPHTHALMIC | Status: DC
  Filled 2015-05-04: qty 4

## 2015-05-04 MED ORDER — FLUORESCEIN SODIUM 1 MG OP STRP
1.0000 | ORAL_STRIP | Freq: Once | OPHTHALMIC | Status: DC
  Filled 2015-05-04: qty 1

## 2015-05-04 NOTE — Discharge Instructions (Signed)
Follow up with an opthalmolgist in 2 days.  Return to the ED if you have increased discharge, redness and swelling around the eye, increased pain, visual changes, nausea, vomiting, severe headache.  Corneal Abrasion The cornea is the clear covering at the front and center of the eye. When you look at the colored portion of the eye, you are looking through the cornea. It is a thin tissue made up of layers. The top layer is the most sensitive layer. A corneal abrasion happens if this layer is scratched or an injury causes it to come off.  HOME CARE  You may be given drops or a medicated cream. Use the medicine as told by your doctor.  A pressure patch may be put over the eye. If this is done, follow your doctor's instructions for when to remove the patch. Do not drive or use machines while the eye patch is on. Judging distances is hard to do with a patch on.  See your doctor for a follow-up exam if you are told to do so. It is very important that you keep this appointment. GET HELP IF:   You have pain, are sensitive to light, and have a scratchy feeling in one eye or both eyes.  Your pressure patch keeps getting loose. You can blink your eye under the patch.  You have fluid coming from your eye or the lids stick together in the morning.  You have the same symptoms in the morning that you did with the first abrasion. This could be days, weeks, or months after the first abrasion healed.   This information is not intended to replace advice given to you by your health care provider. Make sure you discuss any questions you have with your health care provider.   Document Released: 06/21/2007 Document Revised: 09/23/2014 Document Reviewed: 09/09/2012 Elsevier Interactive Patient Education [employer].
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
## 2015-05-04 NOTE — ED Notes (Signed)
Per pt, states left eye irritation since Saturday-states he doesn't know how he irritated it

## 2015-05-29 ENCOUNTER — Other Ambulatory Visit: Payer: Self-pay | Admitting: Emergency Medicine

## 2015-07-06 ENCOUNTER — Other Ambulatory Visit: Payer: Self-pay | Admitting: Emergency Medicine

## 2015-07-07 NOTE — Telephone Encounter (Signed)
Informed patient needs a follow up before prescription runs out.  Patient understood.

## 2015-08-03 ENCOUNTER — Other Ambulatory Visit: Payer: Self-pay | Admitting: Emergency Medicine

## 2016-07-07 IMAGING — CR DG SHOULDER 2+V*R*
3 series · 3 of 3 positions shown · non-contrast
Comparison: None.

CLINICAL DATA: Right shoulder pain.

EXAM:
RIGHT SHOULDER - 2+ VIEW

[AP (1 of 2)]
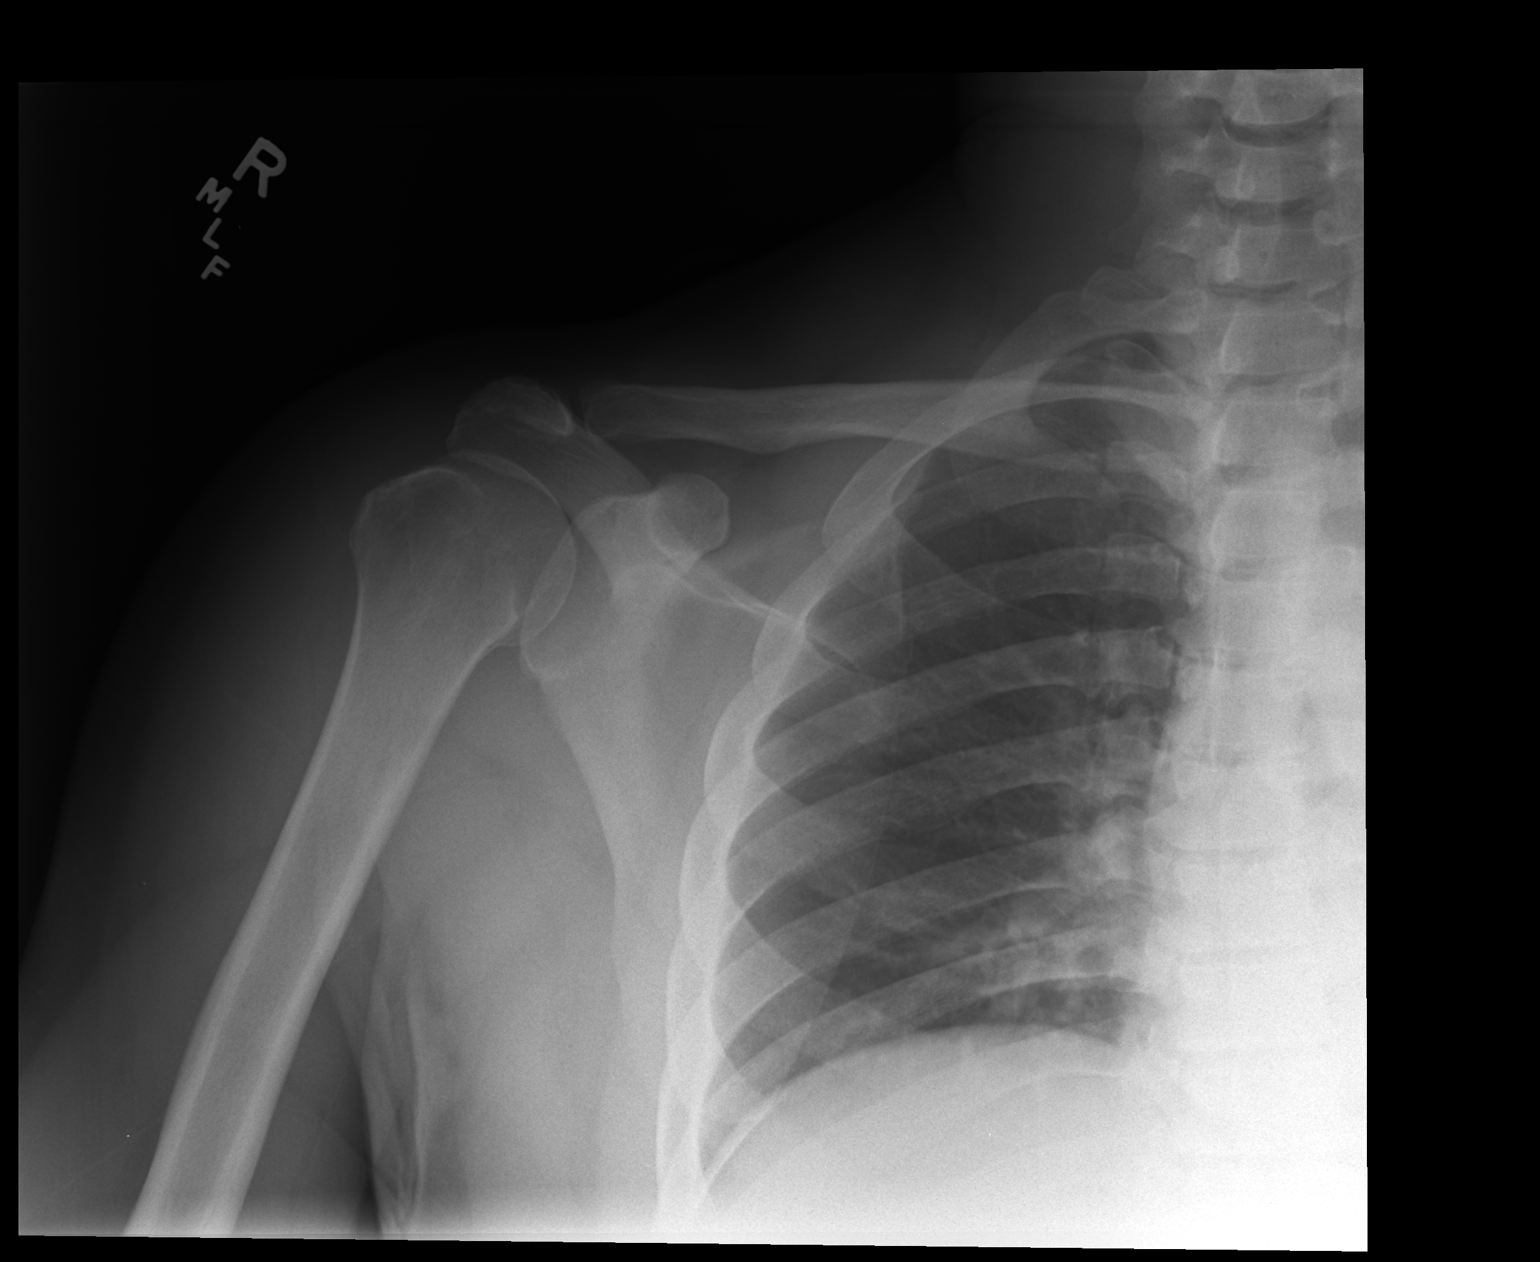

[AP (2 of 2)]
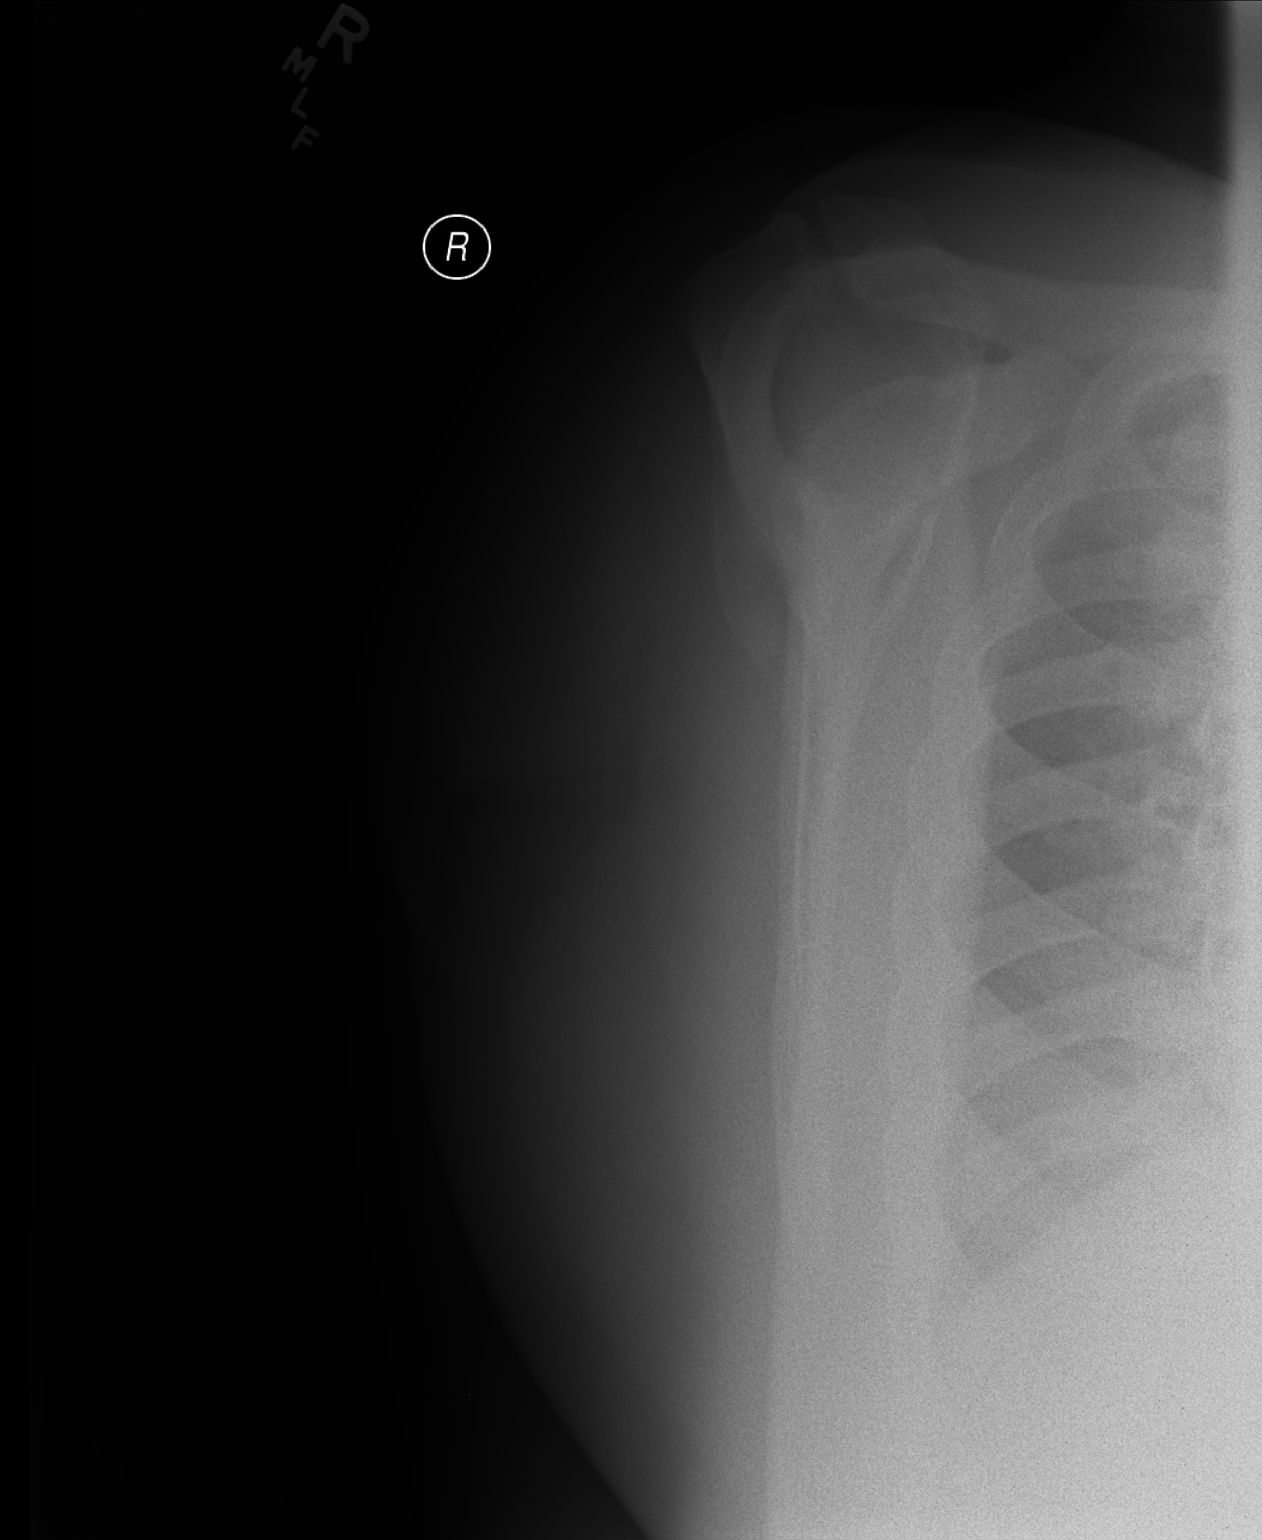

[axial]
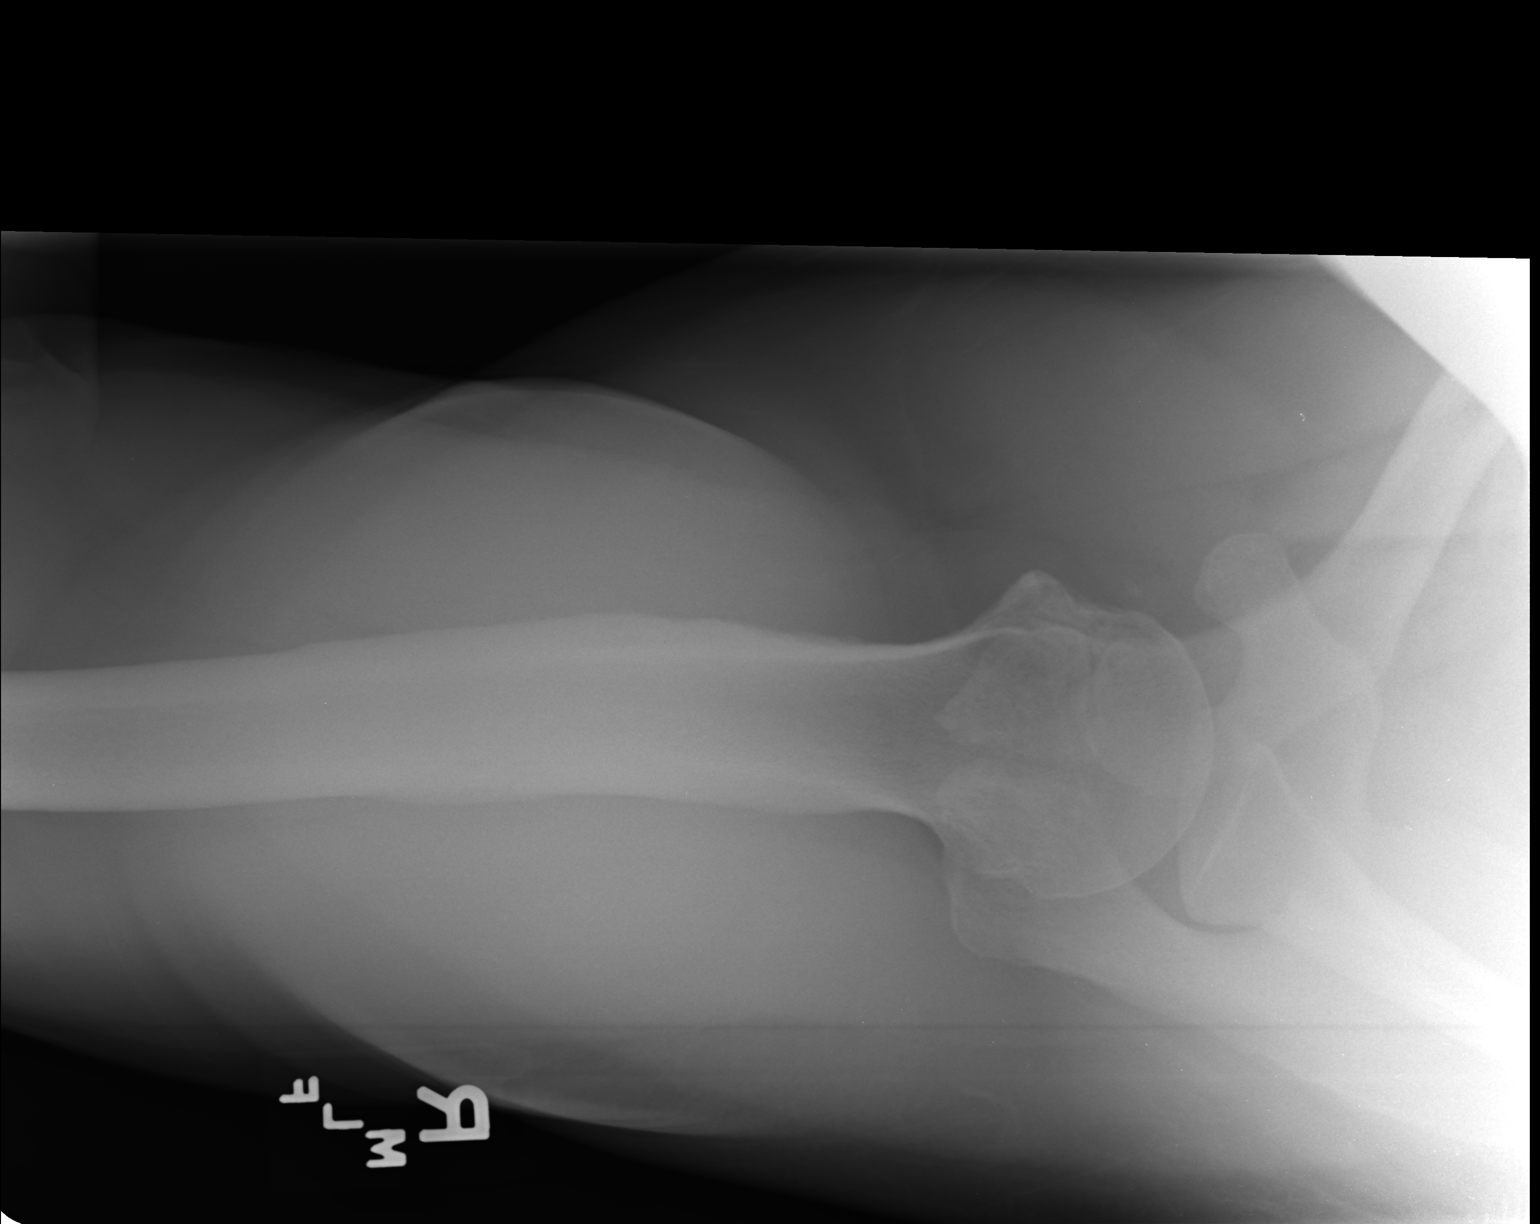

[3 of 3 positions shown; findings below may reference images not displayed]

FINDINGS: No fracture or dislocation.

Os acromiale with degenerative changes.

Visualized lungs clear.
IMPRESSION: No fracture or dislocation.

Os acromiale with degenerative changes.

These results will be called to the ordering clinician or
representative by the Radiologist Assistant, and communication
documented in the PACS or zVision Dashboard.

## 2019-02-18 ENCOUNTER — Encounter (HOSPITAL_COMMUNITY): Payer: Self-pay | Admitting: *Deleted

## 2019-02-18 ENCOUNTER — Emergency Department (HOSPITAL_COMMUNITY): Payer: 59

## 2019-02-18 ENCOUNTER — Emergency Department (HOSPITAL_COMMUNITY)
Admission: EM | Admit: 2019-02-18 | Discharge: 2019-02-18 | Disposition: A | Discharge status: Home / Self Care | Payer: 59 | Attending: Emergency Medicine | Admitting: Emergency Medicine

## 2019-02-18 ENCOUNTER — Other Ambulatory Visit: Payer: Self-pay

## 2019-02-18 DIAGNOSIS — Z79899 Other long term (current) drug therapy: Secondary | ICD-10-CM | POA: Insufficient documentation

## 2019-02-18 DIAGNOSIS — U071 COVID-19: Secondary | ICD-10-CM | POA: Diagnosis not present

## 2019-02-18 DIAGNOSIS — R05 Cough: Secondary | ICD-10-CM | POA: Diagnosis present

## 2019-02-18 DIAGNOSIS — I10 Essential (primary) hypertension: Secondary | ICD-10-CM | POA: Diagnosis not present

## 2019-02-18 HISTORY — DX: Essential (primary) hypertension: I10

## 2019-02-18 LAB — CBC WITH DIFFERENTIAL/PLATELET
Abs Immature Granulocytes: 0.02 10*3/uL (ref 0.00–0.07)
Basophils Absolute: 0.1 10*3/uL (ref 0.0–0.1)
Basophils Relative: 1 %
Eosinophils Absolute: 0 10*3/uL (ref 0.0–0.5)
Eosinophils Relative: 1 %
HCT: 48.3 % (ref 39.0–52.0)
Hemoglobin: 15.5 g/dL (ref 13.0–17.0)
Immature Granulocytes: 0 %
Lymphocytes Relative: 22 %
Lymphs Abs: 1.3 10*3/uL (ref 0.7–4.0)
MCH: 29.1 pg (ref 26.0–34.0)
MCHC: 32.1 g/dL (ref 30.0–36.0)
MCV: 90.6 fL (ref 80.0–100.0)
Monocytes Absolute: 1.1 10*3/uL — ABNORMAL HIGH (ref 0.1–1.0)
Monocytes Relative: 19 %
Neutro Abs: 3.2 10*3/uL (ref 1.7–7.7)
Neutrophils Relative %: 57 %
Platelets: 221 10*3/uL (ref 150–400)
RBC: 5.33 MIL/uL (ref 4.22–5.81)
RDW: 13.1 % (ref 11.5–15.5)
WBC: 5.6 10*3/uL (ref 4.0–10.5)
nRBC: 0 % (ref 0.0–0.2)

## 2019-02-18 LAB — COMPREHENSIVE METABOLIC PANEL
ALT: 20 U/L (ref 0–44)
AST: 19 U/L (ref 15–41)
Albumin: 4 g/dL (ref 3.5–5.0)
Alkaline Phosphatase: 57 U/L (ref 38–126)
Anion gap: 8 (ref 5–15)
BUN: 21 mg/dL (ref 8–23)
CO2: 28 mmol/L (ref 22–32)
Calcium: 8.7 mg/dL — ABNORMAL LOW (ref 8.9–10.3)
Chloride: 101 mmol/L (ref 98–111)
Creatinine, Ser: 1.55 mg/dL — ABNORMAL HIGH (ref 0.61–1.24)
GFR calc Af Amer: 55 mL/min — ABNORMAL LOW (ref >60)
GFR calc non Af Amer: 47 mL/min — ABNORMAL LOW (ref >60)
Glucose, Bld: 100 mg/dL — ABNORMAL HIGH (ref 70–99)
Potassium: 4.1 mmol/L (ref 3.5–5.1)
Sodium: 137 mmol/L (ref 135–145)
Total Bilirubin: 0.4 mg/dL (ref 0.3–1.2)
Total Protein: 8 g/dL (ref 6.5–8.1)

## 2019-02-18 LAB — TROPONIN I (HIGH SENSITIVITY): Troponin I (High Sensitivity): 5 ng/L (ref <18)

## 2019-02-18 LAB — POC SARS CORONAVIRUS 2 AG -  ED: SARS Coronavirus 2 Ag: POSITIVE — AB

## 2019-02-18 MED ORDER — ACETAMINOPHEN 325 MG PO TABS
650.0000 mg | ORAL_TABLET | Freq: Once | ORAL | Status: AC
  Administered 2019-02-18: 650 mg via ORAL
  Filled 2019-02-18: qty 2

## 2019-02-18 NOTE — ED Notes (Signed)
Pt verbalizes understanding of DC instructions. Pt belongings returned and is ambulatory out of ED.  

## 2019-02-18 NOTE — ED Provider Notes (Signed)
[place] DEPT Provider Note   CSN: [id number] Arrival date & time: 02/18/19  1257     History Chief Complaint  Patient presents with  . Cough    Sean Keller is a 64 y.o. male.  Patient with history of hypertension high cholesterol here with a 4-day history of body aches, headache, chills, cough, congestion.  States his wife has been sick with Covid.  He believes he got the flu while working outside in [place] last week.  Contrary to triage note he denies any chest pain.  States he feels tight in his chest but has no pain.  No pain with coughing.  Denies shortness of breath.  Did not check his temperature at home but felt warm.  Has diffuse body aches and chills.  No nausea, vomiting or diarrhea.  The history is provided by the patient.  Cough Associated symptoms: chills, fever, headaches, myalgias, rhinorrhea and shortness of breath   Associated symptoms: no chest pain        Past Medical History:  Diagnosis Date  . Allergy   . Arthritis   . Hypercholesteremia   . Hypertension     Patient Active Problem List   Diagnosis Date Noted  . Hyperlipidemia 01/13/2013  . Low testosterone 01/13/2013  . Seborrhea capitis 01/13/2013    Past Surgical History:  Procedure Laterality Date  . KNEE ARTHROSCOPY Right 1984  . LIPOMA EXCISION Right    shoulder       No family history on file.  Social History   Tobacco Use  . Smoking status: Never Smoker  . Smokeless tobacco: Never Used  Substance Use Topics  . Alcohol use: No  . Drug use: No    Home Medications Prior to Admission medications   Medication Sig Start Date End Date Taking? Authorizing Provider  ANDROGEL PUMP 20.25 MG/ACT (1.62%) GEL APPLY 4 PUMPS TO THE SKIN DAILY Patient not taking: Reported on 05/11/2014 07/29/13   Harrison Mons, PA  erythromycin [facility]) ophthalmic ointment Place 1 application into the left eye 4 (four) times daily. Apply half inch strip into the  lower lid 3-4 times daily 05/04/15   Focht, Jessica L, PA  fluticasone (FLONASE) 50 MCG/ACT nasal spray USE TWO SPRAYS IN EACH NOSTRIL EVERY DAY AS NEEDED 05/11/14   Darlyne Russian, MD  ibuprofen (ADVIL,MOTRIN) 600 MG tablet Take 1 tablet (600 mg total) by mouth every 8 (eight) hours as needed. Patient not taking: Reported on 05/11/2014 08/12/13   Orma Flaming, MD  ketoconazole (NIZORAL) 2 % shampoo Apply topically 2 (two) times a week. 01/13/13   Harrison Mons, PA  meloxicam (MOBIC) 15 MG tablet Take 1 tablet (15 mg total) by mouth daily. 05/11/14   Darlyne Russian, MD  Multiple Vitamin (MULTIVITAMINS PO) Take by mouth.    [provider]  pravastatin (PRAVACHOL) 20 MG tablet TAKE 1 TABLET BY MOUTH EVERY NIGHT 07/07/15   Darlyne Russian, MD  VIAGRA 100 MG tablet TAKE 1 TABLET BY MOUTH EVERY DAY AS NEEDED 08/04/14   Jaynee Eagles, PA-C    Allergies    Patient has no known allergies.  Review of Systems   Review of Systems  Constitutional: Positive for activity change, appetite change, chills, fatigue and fever.  HENT: Positive for congestion and rhinorrhea.   Eyes: Negative for visual disturbance.  Respiratory: Positive for cough and shortness of breath.   Cardiovascular: Negative for chest pain.  Gastrointestinal: Negative for abdominal pain, nausea and vomiting.  Genitourinary: Negative for dysuria and hematuria.  Musculoskeletal: Positive for arthralgias and myalgias.  Neurological: Positive for weakness, light-headedness and headaches.   all other systems are negative except as noted in the HPI and PMH.    Physical Exam Updated Vital Signs BP (!) 144/77 (BP Location: Left Arm)   Pulse 90   Temp 100.1 F (37.8 C) (Oral)   Resp 11   Ht 5\' 10"  (1.778 m)   Wt 102.1 kg   SpO2 95%   BMI 32.28 kg/m   Physical Exam Vitals and nursing note reviewed.  Constitutional:      General: He is not in acute distress.    Appearance: Normal appearance. He is well-developed and normal  weight.  HENT:     Head: Normocephalic and atraumatic.     Mouth/Throat:     Pharynx: No oropharyngeal exudate.  Eyes:     Conjunctiva/sclera: Conjunctivae normal.     Pupils: Pupils are equal, round, and reactive to light.  Neck:     Comments: No meningismus. Cardiovascular:     Rate and Rhythm: Normal rate and regular rhythm.     Heart sounds: Normal heart sounds. No murmur.  Pulmonary:     Effort: Pulmonary effort is normal. No respiratory distress.     Breath sounds: Normal breath sounds.  Chest:     Chest wall: No tenderness.  Abdominal:     Palpations: Abdomen is soft.     Tenderness: There is no abdominal tenderness. There is no guarding or rebound.  Musculoskeletal:        General: No tenderness. Normal range of motion.     Cervical back: Normal range of motion and neck supple.  Skin:    General: Skin is warm.     Capillary Refill: Capillary refill takes less than 2 seconds.  Neurological:     General: No focal deficit present.     Mental Status: He is alert and oriented to person, place, and time. Mental status is at baseline.     Cranial Nerves: No cranial nerve deficit.     Motor: No abnormal muscle tone.     Coordination: Coordination normal.     Comments: No ataxia on finger to nose bilaterally. No pronator drift. 5/5 strength throughout. CN 2-12 intact.Equal grip strength. Sensation intact.   Psychiatric:        Behavior: Behavior normal.     ED Results / Procedures / Treatments   Labs (all labs ordered are listed, but only abnormal results are displayed) Labs Reviewed  CBC WITH DIFFERENTIAL/PLATELET - Abnormal; Notable for the following components:      Result Value   Monocytes Absolute 1.1 (*)    All other components within normal limits  COMPREHENSIVE METABOLIC PANEL - Abnormal; Notable for the following components:   Glucose, Bld 100 (*)    Creatinine, Ser 1.55 (*)    Calcium 8.7 (*)    GFR calc non Af Amer 47 (*)    GFR calc Af Amer 55 (*)     All other components within normal limits  POC SARS CORONAVIRUS 2 AG -  ED - Abnormal; Notable for the following components:   SARS Coronavirus 2 Ag POSITIVE (*)    All other components within normal limits  TROPONIN I (HIGH SENSITIVITY)    EKG EKG Interpretation  Date/Time:  Tuesday February 18 2019 13:08:46 EST Ventricular Rate:  100 PR Interval:    QRS Duration: 97 QT Interval:  322 QTC Calculation: 416 R Axis:   -  51 Text Interpretation: Sinus tachycardia Left anterior fascicular block Low voltage, precordial leads Consider anterior infarct Baseline wander in lead(s) V4 V6 No STEMI Confirmed by Alona Bene [phone]) on 02/18/2019 1:14:40 PM   Radiology DG Chest Portable 1 View  Result Date: 02/18/2019 CLINICAL DATA:  Cough, fever, and chills for 5 days. EXAM: PORTABLE CHEST 1 VIEW COMPARISON:  None. FINDINGS: The heart size and mediastinal contours are within normal limits. Linear opacity in left lung base may be due to mild scarring or atelectasis. No evidence of pulmonary infiltrate or edema. No evidence of pleural effusion. The visualized skeletal structures are unremarkable. IMPRESSION: No active disease. Electronically Signed   By: Danae Orleans M.D.   On: 02/18/2019 18:14    Procedures Procedures (including critical care time)  Medications Ordered in ED Medications  acetaminophen (TYLENOL) tablet 650 mg (has no administration in time range)    ED Course  I have reviewed the triage vital signs and the nursing notes.  Pertinent labs & imaging results that were available during my care of the patient were reviewed by me and considered in my medical decision making (see chart for details).    MDM Rules/Calculators/A&P                      4 days of body aches, cough, fever, chills and aches all over with headache.  Covid contacts at home.  Low suspicion for ACS, PE, aortic dissection  Chest x-ray is negative.  Covid test is positive.  Patient is able to ambulate  without desaturation.  He is tolerating p.o. for admission.  Labs are reassuring. Troponin negative.   Discussed home quarantine, p.o. hydration, antipyretics.  Return to the ED with new or worsening symptoms.  Sean Keller was evaluated in Emergency Department on 02/18/2019 for the symptoms described in the history of present illness. He was evaluated in the context of the global COVID-19 pandemic, which necessitated consideration that the patient might be at risk for infection with the SARS-CoV-2 virus that causes COVID-19. Institutional protocols and algorithms that pertain to the evaluation of patients at risk for COVID-19 are in a state of rapid change based on information released by regulatory bodies including the CDC and federal and state organizations. These policies and algorithms were followed during the patient's care in the ED.  Final Clinical Impression(s) / ED Diagnoses Final diagnoses:  COVID-19 virus infection    Rx / DC Orders ED Discharge Orders    None       Josephene Marrone, Jeannett Senior, MD 02/18/19 2321

## 2019-02-18 NOTE — ED Notes (Signed)
Pt ambulated around room o2 sats remained above 96% on room air. Pt denied SOB on excerption. Provided pt with water for fluid challenge

## 2019-02-18 NOTE — Discharge Instructions (Signed)
Keep yourself quarantined as we discussed.  Use Tylenol or Motrin as needed for aches and fever.  Follow-up with your doctor by phone.  Return to the ED if you have difficulty breathing, persistent nausea and vomiting, chest pain, or other concerns.

## 2019-02-18 NOTE — ED Triage Notes (Signed)
Cough, fever, headache, chest pain and chills x 5 days; patient reports taking 162 mg of aspirin at 1230 today

## 2019-02-21 ENCOUNTER — Encounter (HOSPITAL_COMMUNITY): Payer: Self-pay

## 2019-02-21 ENCOUNTER — Other Ambulatory Visit: Payer: Self-pay | Admitting: Physician Assistant

## 2019-02-21 ENCOUNTER — Ambulatory Visit (HOSPITAL_COMMUNITY)
Admission: RE | Admit: 2019-02-21 | Discharge: 2019-02-21 | Disposition: A | Discharge status: Home / Self Care | Payer: 59 | Source: Ambulatory Visit | Attending: Pulmonary Disease | Admitting: Pulmonary Disease

## 2019-02-21 DIAGNOSIS — U071 COVID-19: Secondary | ICD-10-CM

## 2019-02-21 DIAGNOSIS — I10 Essential (primary) hypertension: Secondary | ICD-10-CM

## 2019-02-21 DIAGNOSIS — E7849 Other hyperlipidemia: Secondary | ICD-10-CM

## 2019-02-21 DIAGNOSIS — Z23 Encounter for immunization: Secondary | ICD-10-CM | POA: Insufficient documentation

## 2019-02-21 MED ORDER — METHYLPREDNISOLONE SODIUM SUCC 125 MG IJ SOLR: 125.0000 mg | Freq: Once | INTRAMUSCULAR | Status: DC | PRN

## 2019-02-21 MED ORDER — ALBUTEROL SULFATE HFA 108 (90 BASE) MCG/ACT IN AERS: 2.0000 | INHALATION_SPRAY | Freq: Once | RESPIRATORY_TRACT | Status: DC | PRN

## 2019-02-21 MED ORDER — DIPHENHYDRAMINE HCL 50 MG/ML IJ SOLN: 50.0000 mg | Freq: Once | INTRAMUSCULAR | Status: DC | PRN

## 2019-02-21 MED ORDER — FAMOTIDINE IN NACL 20-0.9 MG/50ML-% IV SOLN: 20.0000 mg | Freq: Once | INTRAVENOUS | Status: DC | PRN

## 2019-02-21 MED ORDER — EPINEPHRINE 0.3 MG/0.3ML IJ SOAJ: 0.3000 mg | Freq: Once | INTRAMUSCULAR | Status: DC | PRN

## 2019-02-21 MED ORDER — ACETAMINOPHEN 325 MG PO TABS
650.0000 mg | ORAL_TABLET | ORAL | Status: AC
  Administered 2019-02-21: 650 mg via ORAL
  Filled 2019-02-21: qty 2

## 2019-02-21 MED ORDER — SODIUM CHLORIDE 0.9 % IV SOLN
700.0000 mg | Freq: Once | INTRAVENOUS | Status: AC
  Administered 2019-02-21: 700 mg via INTRAVENOUS
  Filled 2019-02-21: qty 20

## 2019-02-21 MED ORDER — SODIUM CHLORIDE 0.9 % IV SOLN: INTRAVENOUS | Status: DC | PRN

## 2019-02-21 NOTE — Discharge Instructions (Signed)

## 2019-02-21 NOTE — Progress Notes (Unsigned)
Hello [place],   You have been scheduled to receive bamlanivumab (the monoclonal antibody we discussed) on Friday 02/21/19 at 1430.  Please arrive 15 minutes early.   The address for the infusion clinic site is:  [address] [place], [place] (previously this was the [facility] [place]   When you drive in the main entrance you will see security -  tell them they are there to get an infusion and they will take you to where you need to go.   If you have questions please call [phone].   Should you develop worsening shortness of breath, chest pain or severe breathing problems please do not wait for this appointment and go to the Emergency room for evaluation and treatment.   The day of your visit you should: Marland Kitchen Get plenty of rest the night before and drink plenty of water . Eat a light meal/snack before coming and take your medications as prescribed  . Wear warm, comfortable clothes with a shirt that can roll-up over the elbow (will need IV start).  . Wear a mask  . Consider bringing some activity to help pass the time  I hope this helps find you feeling better,  Rutherford Guys, PA - C

## 2019-02-21 NOTE — Progress Notes (Signed)
  Diagnosis: COVID-19  Physician: Dr. Patrick Wright  Procedure: [facility] Infusion Clinic Med: bamlanivimab infusion - Provided patient with bamlanimivab fact sheet for patients, parents and caregivers prior to infusion.  Complications: No immediate complications noted.  Discharge: Discharged home   Ally Yow 02/21/2019   

## 2019-02-23 ENCOUNTER — Emergency Department (HOSPITAL_COMMUNITY): Payer: 59

## 2019-02-23 ENCOUNTER — Other Ambulatory Visit: Payer: Self-pay

## 2019-02-23 ENCOUNTER — Encounter (HOSPITAL_COMMUNITY): Payer: Self-pay

## 2019-02-23 ENCOUNTER — Inpatient Hospital Stay (HOSPITAL_COMMUNITY)
Admission: EM | Admit: 2019-02-23 | Discharge: 2019-02-26 | DRG: 177 | Disposition: A | Discharge status: Home / Self Care | Payer: 59 | Attending: Internal Medicine | Admitting: Internal Medicine

## 2019-02-23 DIAGNOSIS — Z79890 Hormone replacement therapy (postmenopausal): Secondary | ICD-10-CM

## 2019-02-23 DIAGNOSIS — Z791 Long term (current) use of non-steroidal anti-inflammatories (NSAID): Secondary | ICD-10-CM

## 2019-02-23 DIAGNOSIS — Z79899 Other long term (current) drug therapy: Secondary | ICD-10-CM

## 2019-02-23 DIAGNOSIS — N182 Chronic kidney disease, stage 2 (mild): Secondary | ICD-10-CM | POA: Diagnosis not present

## 2019-02-23 DIAGNOSIS — E039 Hypothyroidism, unspecified: Secondary | ICD-10-CM | POA: Diagnosis present

## 2019-02-23 DIAGNOSIS — E785 Hyperlipidemia, unspecified: Secondary | ICD-10-CM | POA: Diagnosis present

## 2019-02-23 DIAGNOSIS — I129 Hypertensive chronic kidney disease with stage 1 through stage 4 chronic kidney disease, or unspecified chronic kidney disease: Secondary | ICD-10-CM | POA: Diagnosis present

## 2019-02-23 DIAGNOSIS — U071 COVID-19: Secondary | ICD-10-CM | POA: Diagnosis present

## 2019-02-23 DIAGNOSIS — Z6830 Body mass index (BMI) 30.0-30.9, adult: Secondary | ICD-10-CM

## 2019-02-23 DIAGNOSIS — Z87891 Personal history of nicotine dependence: Secondary | ICD-10-CM | POA: Diagnosis not present

## 2019-02-23 DIAGNOSIS — E669 Obesity, unspecified: Secondary | ICD-10-CM | POA: Diagnosis present

## 2019-02-23 DIAGNOSIS — J9601 Acute respiratory failure with hypoxia: Secondary | ICD-10-CM | POA: Diagnosis present

## 2019-02-23 DIAGNOSIS — J1282 Pneumonia due to coronavirus disease 2019: Secondary | ICD-10-CM

## 2019-02-23 DIAGNOSIS — M1990 Unspecified osteoarthritis, unspecified site: Secondary | ICD-10-CM | POA: Diagnosis present

## 2019-02-23 LAB — CBC WITH DIFFERENTIAL/PLATELET
Abs Immature Granulocytes: 0.07 10*3/uL (ref 0.00–0.07)
Basophils Absolute: 0 10*3/uL (ref 0.0–0.1)
Basophils Relative: 0 %
Eosinophils Absolute: 0 10*3/uL (ref 0.0–0.5)
Eosinophils Relative: 0 %
HCT: 44 % (ref 39.0–52.0)
Hemoglobin: 14.7 g/dL (ref 13.0–17.0)
Immature Granulocytes: 1 %
Lymphocytes Relative: 9 %
Lymphs Abs: 0.9 10*3/uL (ref 0.7–4.0)
MCH: 29.5 pg (ref 26.0–34.0)
MCHC: 33.4 g/dL (ref 30.0–36.0)
MCV: 88.2 fL (ref 80.0–100.0)
Monocytes Absolute: 0.8 10*3/uL (ref 0.1–1.0)
Monocytes Relative: 7 %
Neutro Abs: 8.9 10*3/uL — ABNORMAL HIGH (ref 1.7–7.7)
Neutrophils Relative %: 83 %
Platelets: 227 10*3/uL (ref 150–400)
RBC: 4.99 MIL/uL (ref 4.22–5.81)
RDW: 12.7 % (ref 11.5–15.5)
WBC: 10.8 10*3/uL — ABNORMAL HIGH (ref 4.0–10.5)
nRBC: 0 % (ref 0.0–0.2)

## 2019-02-23 LAB — COMPREHENSIVE METABOLIC PANEL
ALT: 31 U/L (ref 0–44)
AST: 30 U/L (ref 15–41)
Albumin: 3.3 g/dL — ABNORMAL LOW (ref 3.5–5.0)
Alkaline Phosphatase: 47 U/L (ref 38–126)
Anion gap: 10 (ref 5–15)
BUN: 17 mg/dL (ref 8–23)
CO2: 25 mmol/L (ref 22–32)
Calcium: 8.3 mg/dL — ABNORMAL LOW (ref 8.9–10.3)
Chloride: 101 mmol/L (ref 98–111)
Creatinine, Ser: 1.1 mg/dL (ref 0.61–1.24)
GFR calc Af Amer: >60 mL/min (ref >60)
GFR calc non Af Amer: >60 mL/min (ref >60)
Glucose, Bld: 133 mg/dL — ABNORMAL HIGH (ref 70–99)
Potassium: 3.8 mmol/L (ref 3.5–5.1)
Sodium: 136 mmol/L (ref 135–145)
Total Bilirubin: 0.5 mg/dL (ref 0.3–1.2)
Total Protein: 7.2 g/dL (ref 6.5–8.1)

## 2019-02-23 LAB — FERRITIN: Ferritin: 185 ng/mL (ref 24–336)

## 2019-02-23 LAB — D-DIMER, QUANTITATIVE: D-Dimer, Quant: 0.86 ug/mL-FEU — ABNORMAL HIGH (ref 0.00–0.50)

## 2019-02-23 LAB — HIV ANTIBODY (ROUTINE TESTING W REFLEX): HIV Screen 4th Generation wRfx: NONREACTIVE

## 2019-02-23 LAB — PROCALCITONIN: Procalcitonin: <0.1 ng/mL

## 2019-02-23 LAB — LACTIC ACID, PLASMA: Lactic Acid, Venous: 0.8 mmol/L (ref 0.5–1.9)

## 2019-02-23 LAB — LACTATE DEHYDROGENASE: LDH: 215 U/L — ABNORMAL HIGH (ref 98–192)

## 2019-02-23 LAB — TRIGLYCERIDES: Triglycerides: 96 mg/dL (ref <150)

## 2019-02-23 LAB — C-REACTIVE PROTEIN: CRP: 21.1 mg/dL — ABNORMAL HIGH (ref <1.0)

## 2019-02-23 LAB — ABO/RH: ABO/RH(D): O POS

## 2019-02-23 LAB — FIBRINOGEN: Fibrinogen: 783 mg/dL — ABNORMAL HIGH (ref 210–475)

## 2019-02-23 MED ORDER — HYDRALAZINE HCL 20 MG/ML IJ SOLN: 10.0000 mg | Freq: Four times a day (QID) | INTRAMUSCULAR | Status: DC | PRN

## 2019-02-23 MED ORDER — ONDANSETRON HCL 4 MG PO TABS: 4.0000 mg | ORAL_TABLET | Freq: Four times a day (QID) | ORAL | Status: DC | PRN

## 2019-02-23 MED ORDER — ENOXAPARIN SODIUM 60 MG/0.6ML ~~LOC~~ SOLN
50.0000 mg | SUBCUTANEOUS | Status: DC
  Administered 2019-02-23 – 2019-02-25 (×3): 50 mg via SUBCUTANEOUS
  Filled 2019-02-23 (×6): qty 0.6

## 2019-02-23 MED ORDER — LEVOTHYROXINE SODIUM 50 MCG PO TABS
50.0000 ug | ORAL_TABLET | Freq: Every day | ORAL | Status: DC
  Administered 2019-02-23 – 2019-02-26 (×4): 50 ug via ORAL
  Filled 2019-02-23 (×4): qty 1

## 2019-02-23 MED ORDER — FOLIC ACID 1 MG PO TABS
1.0000 mg | ORAL_TABLET | Freq: Every day | ORAL | Status: DC
  Administered 2019-02-23 – 2019-02-26 (×4): 1 mg via ORAL
  Filled 2019-02-23 (×5): qty 1

## 2019-02-23 MED ORDER — ADULT MULTIVITAMIN W/MINERALS CH
1.0000 | ORAL_TABLET | Freq: Every day | ORAL | Status: DC
  Administered 2019-02-23 – 2019-02-26 (×4): 1 via ORAL
  Filled 2019-02-23 (×6): qty 1

## 2019-02-23 MED ORDER — HYDROCOD POLST-CPM POLST ER 10-8 MG/5ML PO SUER: 5.0000 mL | Freq: Two times a day (BID) | ORAL | Status: DC | PRN

## 2019-02-23 MED ORDER — ACETAMINOPHEN 325 MG PO TABS: 650.0000 mg | ORAL_TABLET | Freq: Four times a day (QID) | ORAL | Status: DC | PRN

## 2019-02-23 MED ORDER — POLYETHYLENE GLYCOL 3350 17 G PO PACK: 17.0000 g | PACK | Freq: Every day | ORAL | Status: DC | PRN

## 2019-02-23 MED ORDER — DEXAMETHASONE SODIUM PHOSPHATE 10 MG/ML IJ SOLN
10.0000 mg | Freq: Once | INTRAMUSCULAR | Status: AC
  Administered 2019-02-23: 10 mg via INTRAVENOUS
  Filled 2019-02-23: qty 1

## 2019-02-23 MED ORDER — ZOLPIDEM TARTRATE 5 MG PO TABS: 5.0000 mg | ORAL_TABLET | Freq: Every evening | ORAL | Status: DC | PRN

## 2019-02-23 MED ORDER — ALBUTEROL SULFATE HFA 108 (90 BASE) MCG/ACT IN AERS
2.0000 | INHALATION_SPRAY | Freq: Four times a day (QID) | RESPIRATORY_TRACT | Status: DC
  Administered 2019-02-23 – 2019-02-26 (×12): 2 via RESPIRATORY_TRACT
  Filled 2019-02-23: qty 6.7

## 2019-02-23 MED ORDER — DEXAMETHASONE SODIUM PHOSPHATE 10 MG/ML IJ SOLN
6.0000 mg | INTRAMUSCULAR | Status: DC
  Administered 2019-02-24 – 2019-02-26 (×3): 6 mg via INTRAVENOUS
  Filled 2019-02-23 (×4): qty 1

## 2019-02-23 MED ORDER — SODIUM CHLORIDE 0.9 % IV SOLN
100.0000 mg | Freq: Every day | INTRAVENOUS | Status: DC
  Administered 2019-02-24 – 2019-02-26 (×3): 100 mg via INTRAVENOUS
  Filled 2019-02-23 (×3): qty 20

## 2019-02-23 MED ORDER — SODIUM CHLORIDE 0.9 % IV SOLN
200.0000 mg | Freq: Once | INTRAVENOUS | Status: AC
  Administered 2019-02-23: 200 mg via INTRAVENOUS
  Filled 2019-02-23: qty 200

## 2019-02-23 MED ORDER — DOCUSATE SODIUM 100 MG PO CAPS
100.0000 mg | ORAL_CAPSULE | Freq: Two times a day (BID) | ORAL | Status: DC
  Administered 2019-02-23 – 2019-02-26 (×6): 100 mg via ORAL
  Filled 2019-02-23 (×7): qty 1

## 2019-02-23 MED ORDER — THIAMINE HCL 100 MG PO TABS
100.0000 mg | ORAL_TABLET | Freq: Every day | ORAL | Status: DC
  Administered 2019-02-23 – 2019-02-26 (×4): 100 mg via ORAL
  Filled 2019-02-23 (×6): qty 1

## 2019-02-23 MED ORDER — SENNA 8.6 MG PO TABS
1.0000 | ORAL_TABLET | Freq: Two times a day (BID) | ORAL | Status: DC
  Administered 2019-02-23 – 2019-02-26 (×6): 8.6 mg via ORAL
  Filled 2019-02-23 (×6): qty 1

## 2019-02-23 MED ORDER — ONDANSETRON HCL 4 MG/2ML IJ SOLN: 4.0000 mg | Freq: Four times a day (QID) | INTRAMUSCULAR | Status: DC | PRN

## 2019-02-23 MED ORDER — PRAVASTATIN SODIUM 10 MG PO TABS
20.0000 mg | ORAL_TABLET | Freq: Every day | ORAL | Status: DC
  Administered 2019-02-23 – 2019-02-25 (×3): 20 mg via ORAL
  Filled 2019-02-23 (×3): qty 2

## 2019-02-23 MED ORDER — GUAIFENESIN-DM 100-10 MG/5ML PO SYRP: 10.0000 mL | ORAL_SOLUTION | ORAL | Status: DC | PRN

## 2019-02-23 NOTE — ED Notes (Signed)
Pt ambulated to room, maintained steady gait, w/o assistance of tech.

## 2019-02-23 NOTE — ED Notes (Signed)
I have just called report to Danella Deis, RN at [facility]; and I have notified [facility] of imminent transfer.

## 2019-02-23 NOTE — ED Notes (Signed)
Transport to [facility]. At this time per [facility].

## 2019-02-23 NOTE — ED Provider Notes (Signed)
[facility]-EMERGENCY DEPT Provider Note   CSN: [id number] Arrival date & time: 02/23/19  1137     History Chief Complaint  Patient presents with  . COVID+ persistent cough    Sean Keller is a 64 y.o. male with history of hypertension, hyperlipidemia, seasonal allergies presents for evaluation of acute onset, progressively worsening Covid symptoms for 10 days.  Tested positive for 5 days ago.  His wife has similar symptoms but he reports that "she seems to be better off than I am".  Over the last 2 days he is noted significantly worsening dyspnea on exertion and states that he cannot walk more than a few feet without feeling winded.  He denies chest pain, nausea, vomiting, or diarrhea.  No urinary symptoms.  He notes generalized mild abdominal discomfort over the last several days.  Has had subjective fevers.  He has been taking NyQuil and DayQuil with little relief in symptoms.  He was set up to receive monoclonal antibodies outpatient and had his first infusion 2 days ago.  He is a former smoker.  The history is provided by the patient and medical records.       Past Medical History:  Diagnosis Date  . Allergy   . Arthritis   . Hypercholesteremia   . Hypertension     Patient Active Problem List   Diagnosis Date Noted  . Pneumonia due to COVID-19 virus 02/23/2019  . Hyperlipidemia 01/13/2013  . Low testosterone 01/13/2013  . Seborrhea capitis 01/13/2013    Past Surgical History:  Procedure Laterality Date  . KNEE ARTHROSCOPY Right 1984  . LIPOMA EXCISION Right    shoulder       No family history on file.  Social History   Tobacco Use  . Smoking status: Never Smoker  . Smokeless tobacco: Never Used  Substance Use Topics  . Alcohol use: No  . Drug use: No    Home Medications Prior to Admission medications   Medication Sig Start Date End Date Taking? Authorizing Provider  fluticasone (FLONASE) 50 MCG/ACT nasal spray USE TWO SPRAYS IN  EACH NOSTRIL EVERY DAY AS NEEDED Patient taking differently: Place 2 sprays into both nostrils daily as needed for allergies or rhinitis.  05/11/14  Yes Collene Gobble, MD  levothyroxine (SYNTHROID) 50 MCG tablet Take 50 mcg by mouth daily. 01/03/19  Yes [provider]  losartan (COZAAR) 50 MG tablet Take 50 mg by mouth daily. 02/10/19  Yes [provider]  meloxicam (MOBIC) 15 MG tablet Take 1 tablet (15 mg total) by mouth daily. 05/11/14  Yes Collene Gobble, MD  Multiple Vitamin (MULTIVITAMINS PO) Take 1 tablet by mouth daily.    Yes [provider]  pravastatin (PRAVACHOL) 20 MG tablet TAKE 1 TABLET BY MOUTH EVERY NIGHT Patient taking differently: Take 20 mg by mouth daily.  07/07/15  Yes Daub, Maylon Peppers, MD  VIAGRA 100 MG tablet TAKE 1 TABLET BY MOUTH EVERY DAY AS NEEDED Patient taking differently: Take 100 mg by mouth as needed for erectile dysfunction.  08/04/14  Yes Wallis Bamberg, PA-C  Vitamin D, Ergocalciferol, (DRISDOL) 1.25 MG (50000 UNIT) CAPS capsule Take 50,000 Units by mouth once a week. 01/06/19  Yes [provider]    Allergies    Patient has no known allergies.  Review of Systems   Review of Systems  Constitutional: Positive for chills, fatigue and fever.  Respiratory: Positive for cough and shortness of breath.   Cardiovascular: Negative for chest pain.  Gastrointestinal: Positive for abdominal pain. Negative for constipation, diarrhea, nausea and vomiting.  Neurological: Positive for headaches.  All other systems reviewed and are negative.   Physical Exam Updated Vital Signs BP (!) 137/91   Pulse 84   Temp 99 F (37.2 C) (Oral)   Resp 20   SpO2 93%   Physical Exam Vitals and nursing note reviewed.  Constitutional:      General: He is not in acute distress.    Appearance: He is well-developed.  HENT:     Head: Normocephalic and atraumatic.  Eyes:     General:        Right eye: No discharge.        Left eye: No discharge.      Conjunctiva/sclera: Conjunctivae normal.  Neck:     Vascular: No JVD.     Trachea: No tracheal deviation.  Cardiovascular:     Rate and Rhythm: Normal rate and regular rhythm.     Pulses: Normal pulses.     Heart sounds: Normal heart sounds.     Comments: 2+ radial and DP/PT pulses bilaterally, Homans sign absent bilaterally, no lower extremity edema, no palpable cords, compartments are soft  Pulmonary:     Comments: Tachypneic.  Speaking in full sentences but SPO2 saturations down to 87% on room air with good waveform.  Improved to 97% on 2 L supplemental oxygen via nasal cannula Abdominal:     General: Bowel sounds are normal. There is no distension.     Palpations: Abdomen is soft.     Tenderness: There is no abdominal tenderness. There is no right CVA tenderness, left CVA tenderness, guarding or rebound.  Musculoskeletal:     Cervical back: Normal range of motion and neck supple.  Skin:    General: Skin is warm and dry.     Capillary Refill: Capillary refill takes less than 2 seconds.     Findings: No erythema.  Neurological:     Mental Status: He is alert.  Psychiatric:        Behavior: Behavior normal.     ED Results / Procedures / Treatments   Labs (all labs ordered are listed, but only abnormal results are displayed) Labs Reviewed  CBC WITH DIFFERENTIAL/PLATELET - Abnormal; Notable for the following components:      Result Value   WBC 10.8 (*)    Neutro Abs 8.9 (*)    All other components within normal limits  COMPREHENSIVE METABOLIC PANEL - Abnormal; Notable for the following components:   Glucose, Bld 133 (*)    Calcium 8.3 (*)    Albumin 3.3 (*)    All other components within normal limits  D-DIMER, QUANTITATIVE (NOT AT [facility]) - Abnormal; Notable for the following components:   D-Dimer, Quant 0.86 (*)    All other components within normal limits  LACTATE DEHYDROGENASE - Abnormal; Notable for the following components:   LDH 215 (*)    All other components  within normal limits  FIBRINOGEN - Abnormal; Notable for the following components:   Fibrinogen 783 (*)    All other components within normal limits  C-REACTIVE PROTEIN - Abnormal; Notable for the following components:   CRP 21.1 (*)    All other components within normal limits  CULTURE, BLOOD (ROUTINE X 2)  CULTURE, BLOOD (ROUTINE X 2)  LACTIC ACID, PLASMA  PROCALCITONIN  FERRITIN  TRIGLYCERIDES  HIV ANTIBODY (ROUTINE TESTING W REFLEX)  ABO/RH    EKG None  Radiology DG Chest [facility] [address]  Result Date: 02/23/2019 CLINICAL DATA:  Shortness of breath, COVID-19 positive. EXAM: PORTABLE CHEST 1 VIEW COMPARISON:  February 18, 2019. FINDINGS: The heart size and mediastinal contours are within normal limits. No pneumothorax or pleural effusion is noted. Bilateral lower lobe airspace opacities are noted consistent with multifocal pneumonia. The visualized skeletal structures are unremarkable. IMPRESSION: Bilateral lower lobe airspace opacities are noted consistent with multifocal pneumonia. Electronically Signed   By: Lupita Raider M.D.   On: 02/23/2019 13:04    Procedures .Critical Care Performed by: Jeanie Sewer, PA-C Authorized by: Jeanie Sewer, PA-C   Critical care provider statement:    Critical care time (minutes):  45   Critical care was necessary to treat or prevent imminent or life-threatening deterioration of the following conditions:  Respiratory failure   Critical care was time spent personally by me on the following activities:  Discussions with consultants, evaluation of patient's response to treatment, examination of patient, ordering and performing treatments and interventions, ordering and review of laboratory studies, ordering and review of radiographic studies, pulse oximetry, re-evaluation of patient's condition, obtaining history from patient or surrogate and review of old charts   (including critical care time)  Medications Ordered in ED Medications  remdesivir  200 mg in sodium chloride 0.9% 250 mL IVPB (has no administration in time range)    Followed by  remdesivir 100 mg in sodium chloride 0.9 % 100 mL IVPB (has no administration in time range)  dexamethasone (DECADRON) injection 10 mg (10 mg Intravenous Given 02/23/19 1315)    ED Course  I have reviewed the triage vital signs and the nursing notes.  Pertinent labs & imaging results that were available during my care of the patient were reviewed by me and considered in my medical decision making (see chart for details).    MDM Rules/Calculators/A&P                      Sean Keller was evaluated in Emergency Department on 02/23/2019 for the symptoms described in the history of present illness. He was evaluated in the context of the global COVID-19 pandemic, which necessitated consideration that the patient might be at risk for infection with the SARS-CoV-2 virus that causes COVID-19. Institutional protocols and algorithms that pertain to the evaluation of patients at risk for COVID-19 are in a state of rapid change based on information released by regulatory bodies including the CDC and federal and state organizations. These policies and algorithms were followed during the patient's care in the ED.  Patient known to be Covid positive presents for worsening symptoms.  He exhibits low-grade fever, SPO2 saturations down to 87% on room air.  He is improved on 2 L supplemental oxygen.  Chest x-ray shows multifocal pneumonia.  Remainder of lab work reviewed by me shows mild nonspecific leukocytosis, no renal insufficiency, no metabolic derangements.  His inflammatory markers are elevated.  He was given IV dexamethasone in the ED.  Spoke with Dr. Pola Corn with [facility] hospitalist service who agrees to assume care of patient and bring him in to the hospital for further evaluation and management.   Final Clinical Impression(s) / ED Diagnoses Final diagnoses:  Acute respiratory failure with hypoxia (HCC)    Pneumonia due to COVID-19 virus    Rx / DC Orders ED Discharge Orders    None       Bennye Alm 02/23/19 1522    Raeford Razor, MD 02/23/19 1645

## 2019-02-23 NOTE — Progress Notes (Signed)
Pt admitted to room 139 via [facility] transport. Pt AOX4. 02@93 %RA. No distress noted. Pt oriented to room and callbell. callbell and personal items within reach.

## 2019-02-23 NOTE — Plan of Care (Signed)
  Problem: Education: Goal: Knowledge of risk factors and measures for prevention of condition will improve Outcome: Progressing   Problem: Coping: Goal: Psychosocial and spiritual needs will be supported Outcome: Progressing   Problem: Respiratory: Goal: Will maintain a patent airway Outcome: Progressing Goal: Complications related to the disease process, condition or treatment will be avoided or minimized Outcome: Progressing   Problem: Education: Goal: Knowledge of General Education information will improve Description: Including pain rating scale, medication(s)/side effects and non-pharmacologic comfort measures Outcome: Progressing   Problem: Health Behavior/Discharge Planning: Goal: Ability to manage health-related needs will improve Outcome: Progressing   Problem: Clinical Measurements: Goal: Ability to maintain clinical measurements within normal limits will improve Outcome: Progressing Goal: Will remain free from infection Outcome: Progressing Goal: Diagnostic test results will improve Outcome: Progressing Goal: Respiratory complications will improve Outcome: Progressing Goal: Cardiovascular complication will be avoided Outcome: Progressing   Problem: Activity: Goal: Risk for activity intolerance will decrease Outcome: Progressing   Problem: Activity: Goal: Risk for activity intolerance will decrease Outcome: Progressing   Problem: Nutrition: Goal: Adequate nutrition will be maintained Outcome: Progressing   Problem: Coping: Goal: Level of anxiety will decrease Outcome: Progressing   Problem: Elimination: Goal: Will not experience complications related to bowel motility Outcome: Progressing Goal: Will not experience complications related to urinary retention Outcome: Progressing   Problem: Pain Managment: Goal: General experience of comfort will improve Outcome: Progressing   Problem: Safety: Goal: Ability to remain free from injury will  improve Outcome: Progressing   Problem: Skin Integrity: Goal: Risk for impaired skin integrity will decrease Outcome: Progressing

## 2019-02-23 NOTE — ED Triage Notes (Signed)
He states he was tested for COVID and was found + 5 days ago. He underwent infusion at [facility]; and is c/o persistent cough. He is ambulatory and in no distress. He is healthy-looking.

## 2019-02-23 NOTE — H&P (Signed)
[facility] History and Physical  Sean Keller HCW:[id number] DOB: Nov 04, 1955 DOA: 02/23/2019 Referring physician: ED PCP: [facility] [place]  Chief Complaint: Shortness of breath, cough, fatigue Came from: Home ------------------------------------------------------------------------------------------------------ Assessment/Plan: Active Problems:   Pneumonia due to COVID-19 virus  COVID pneumonia Acute respiratory failure with hypoxia 2lpm -Presented with cough, shortness of breath, fatigue -Chest imaging -  bilateral lower lobe airspace opacities are noted consistent with multifocal pneumonia. -Treatment: Decadron 6 mg daily for 10 days, IV Remdesivir for 5 days to complete on 2/10. -CRP is elevated to 21, patient is hypoxic but currently only on 2 L/min O2.  He does not qualify for Actemra at this time.  If symptoms/oxygen requirement worsen, patient gives me a verbal consent to be to be given Actemra. -Supportive care: Vitamin C, inhalers, Tylenol, Antitussives - benzonatate, Mucinex -Oxygen - SpO2: 93 % O2 Flow Rate (L/min): 1 L/min -Continue airborne/contact isolation precautions. -WBC and inflammatory markers trend as below.  No results found for: [place]  Lab 02/18/19 1807 02/23/19 1318  WBC 5.6 10.8*   Recent Labs    02/23/19 1318  DDIMER 0.86*  FERRITIN 185  LDH 215*  CRP 21.1*   Hypertension -Keep losartan on hold.  Continue to monitor blood pressure and renal function.  Hyperlipidemia -Continue pravastatin.  Liver enzymes normal.  Hypothyroidism -Continue Synthroid  Arthritis -Keep meloxicam on hold.  Tylenol as needed  Mobility: Encourage ambulation Diet: Cardiac diet DVT prophylaxis:  Lovenox subcu Code Status:  Full code Family Communication:  No family at bedside. Disposition Plan:  Plan to admit to [facility] today.  Hopefully home in 2 to 3  days.  ----------------------------------------------------------------------------------------------------- History of Present Illness: Sean Keller is a 64 y.o. male with past medical history significant for hypertension, hyperlipidemia, arthritis. Patient presented to the ED today with complaint of worsening cough, shortness of breath and fatigue. 10 days ago, patient started having cough, shortness of breath was progressively worsened.  His wife at home was positive with Covid. 2/2, patient came to the ED and was diagnosed with Covid infection.  He was not hypoxic and did not require inpatient admission at the time and was hence discharged home.  2/5, patient received Bamlanivimab infusion as an outpatient.  His symptoms however continued to worsen.  He is now short of breath even on walking to the bathroom.  2/7 -returned back to the ED with worsening symptoms.  In the ED, patient had a temperature of 99, hypoxic to 88 on room air and hence currently on 1 to 2 L of oxygen by nasal cannula.  Work-up showed sodium 136, potassium 3.8, BUN/creatinine 17/1.1 which is better than 21/1.55, five days ago, liver enzymes normal. WBC count only slightly elevated at 10.8, hemoglobin 14.7, platelet 227 Covid antigen positive on 2/2 Chest x-ray today shows bilateral lower lobe airspace opacities are noted consistent with multifocal pneumonia. CRP elevated to 21.1  Procalcitonin less than 0.1  D-dimer elevated to 0.86 LDH 215 Ferritin 185  Hospitalist service was consulted for inpatient admission and management. Review of Systems:  All systems were reviewed and were negative unless otherwise mentioned in the HPI   Past medical history: Past Medical History:  Diagnosis Date  . Allergy   . Arthritis   . Hypercholesteremia   . Hypertension     Past surgical history: Past Surgical History:  Procedure Laterality Date  . KNEE ARTHROSCOPY Right 1984  . LIPOMA EXCISION Right    shoulder     Social History:  reports that he has never smoked. He has never used smokeless tobacco. He reports that he does not drink alcohol or use drugs.  Allergies:  No Known Allergies  Family history:  No family history on file.  Family story reviewed with patient.  Not pertinent to current presentation.  Home Meds: Prior to Admission medications   Medication Sig Start Date End Date Taking? Authorizing Provider  fluticasone (FLONASE) 50 MCG/ACT nasal spray USE TWO SPRAYS IN EACH NOSTRIL EVERY DAY AS NEEDED Patient taking differently: Place 2 sprays into both nostrils daily as needed for allergies or rhinitis.  05/11/14  Yes Collene Gobble, MD  levothyroxine (SYNTHROID) 50 MCG tablet Take 50 mcg by mouth daily. 01/03/19  Yes [provider]  losartan (COZAAR) 50 MG tablet Take 50 mg by mouth daily. 02/10/19  Yes [provider]  meloxicam (MOBIC) 15 MG tablet Take 1 tablet (15 mg total) by mouth daily. 05/11/14  Yes Collene Gobble, MD  Multiple Vitamin (MULTIVITAMINS PO) Take 1 tablet by mouth daily.    Yes [provider]  pravastatin (PRAVACHOL) 20 MG tablet TAKE 1 TABLET BY MOUTH EVERY NIGHT Patient taking differently: Take 20 mg by mouth daily.  07/07/15  Yes Daub, Maylon Peppers, MD  VIAGRA 100 MG tablet TAKE 1 TABLET BY MOUTH EVERY DAY AS NEEDED Patient taking differently: Take 100 mg by mouth as needed for erectile dysfunction.  08/04/14  Yes Wallis Bamberg, PA-C  Vitamin D, Ergocalciferol, (DRISDOL) 1.25 MG (50000 UNIT) CAPS capsule Take 50,000 Units by mouth once a week. 01/06/19  Yes [provider]    Physical Exam: Vitals:   02/23/19 1253 02/23/19 1400 02/23/19 1402 02/23/19 1445  BP:  (!) 137/92  (!) 137/91  Pulse: 88 80 82 84  Resp:  16 (!) 23 20  Temp:      TempSrc:      SpO2: 97% 93% 94% 93%   Wt Readings from Last 3 Encounters:  02/18/19 102.1 kg  05/11/14 107.1 kg  08/12/13 102.8 kg   There is no height or weight on file to calculate  BMI.  General exam: Appears calm and comfortable.  Looks tired. Skin: No rashes, lesions or ulcers. HEENT: Atraumatic, normocephalic, supple neck, no obvious bleeding Lungs: Minimal bibasilar rales.  No wheezing CVS: Regular rate and rhythm, no murmur GI/Abd soft, nontender, nondistended, bowel sound present CNS: Alert, awake, oriented x3 Psychiatry: Tired look.  Mood appropriate Extremities: No pedal edema, no calf tenderness     Consult Orders  (From admission, onward)         Start     Ordered   02/23/19 1409  Consult to hospitalist  ALL PATIENTS BEING ADMITTED/HAVING PROCEDURES NEED COVID-19 SCREENING  Once    Comments: ALL PATIENTS BEING ADMITTED/HAVING PROCEDURES NEED COVID-19 SCREENING  Provider:  (Not yet assigned)  Question Answer Comment  Place call to: [facility]   Reason for Consult Admit      02/23/19 1408          Labs on Admission:   CBC: Recent Labs  Lab 02/18/19 1807 02/23/19 1318  WBC 5.6 10.8*  NEUTROABS 3.2 8.9*  HGB 15.5 14.7  HCT 48.3 44.0  MCV 90.6 88.2  PLT 221 227    Basic Metabolic Panel: Recent Labs  Lab 02/18/19 1807 02/23/19 1318  NA 137 136  K 4.1 3.8  CL 101 101  CO2 28 25  GLUCOSE 100* 133*  BUN 21 17  CREATININE 1.55* 1.10  CALCIUM 8.7* 8.3*    Liver Function Tests: Recent Labs  Lab 02/18/19 1807 02/23/19 1318  AST 19 30  ALT 20 31  ALKPHOS 57 47  BILITOT 0.4 0.5  PROT 8.0 7.2  ALBUMIN 4.0 3.3*   No results for input(s): LIPASE, AMYLASE in the last 168 hours. No results for input(s): AMMONIA in the last 168 hours.  Cardiac Enzymes: No results for input(s): CKTOTAL, CKMB, CKMBINDEX, TROPONINI in the last 168 hours.  BNP (last 3 results) No results for input(s): BNP in the last 8760 hours.  ProBNP (last 3 results) No results for input(s): PROBNP in the last 8760 hours.  CBG: No results for input(s): GLUCAP in the last 168 hours.  Lipase  No results found for: LIPASE   Urinalysis     Component Value Date/Time   BILIRUBINUR neg 01/13/2013 1218   PROTEINUR trace 01/13/2013 1218   UROBILINOGEN 0.2 01/13/2013 1218   NITRITE neg 01/13/2013 1218   LEUKOCYTESUR Negative 01/13/2013 1218     Drugs of Abuse  No results found for: LABOPIA, COCAINSCRNUR, LABBENZ, AMPHETMU, THCU, LABBARB    Radiological Exams on Admission: DG Chest Port 1 View  Result Date: 02/23/2019 CLINICAL DATA:  Shortness of breath, COVID-19 positive. EXAM: PORTABLE CHEST 1 VIEW COMPARISON:  February 18, 2019. FINDINGS: The heart size and mediastinal contours are within normal limits. No pneumothorax or pleural effusion is noted. Bilateral lower lobe airspace opacities are noted consistent with multifocal pneumonia. The visualized skeletal structures are unremarkable. IMPRESSION: Bilateral lower lobe airspace opacities are noted consistent with multifocal pneumonia. Electronically Signed   By: Lupita Raider M.D.   On: 02/23/2019 13:04   ----------------------------------------------------------------------------------------------------------------------------------------------------------- Severity of Illness: The appropriate patient status for this patient is INPATIENT. Inpatient status is judged to be reasonable and necessary in order to provide the required intensity of service to ensure the patient's safety. The patient's presenting symptoms, physical exam findings, and initial radiographic and laboratory data in the context of their chronic comorbidities is felt to place them at high risk for further clinical deterioration. Furthermore, it is not anticipated that the patient will be medically stable for discharge from the hospital within 2 midnights of admission. The following factors support the patient status of inpatient.   " The patient's presenting symptoms include cough, shortness of breath, fatigue" The worrisome physical exam findings include hypoxia. " The initial radiographic and laboratory data are  worrisome because of Covid positivity, elevated inflammatory markers. " The chronic co-morbidities include hypertension, hyperlipidemia.   * I certify that at the point of admission it is my clinical judgment that the patient will require inpatient hospital care spanning beyond 2 midnights from the point of admission due to high intensity of service, high risk for further deterioration and high frequency of surveillance required.*   Signed, Lorin Glass, MD [facility] 02/23/2019

## 2019-02-24 LAB — COMPREHENSIVE METABOLIC PANEL
ALT: 36 U/L (ref 0–44)
AST: 34 U/L (ref 15–41)
Albumin: 3.2 g/dL — ABNORMAL LOW (ref 3.5–5.0)
Alkaline Phosphatase: 50 U/L (ref 38–126)
Anion gap: 10 (ref 5–15)
BUN: 23 mg/dL (ref 8–23)
CO2: 25 mmol/L (ref 22–32)
Calcium: 8.6 mg/dL — ABNORMAL LOW (ref 8.9–10.3)
Chloride: 103 mmol/L (ref 98–111)
Creatinine, Ser: 1.18 mg/dL (ref 0.61–1.24)
GFR calc Af Amer: >60 mL/min (ref >60)
GFR calc non Af Amer: >60 mL/min (ref >60)
Glucose, Bld: 132 mg/dL — ABNORMAL HIGH (ref 70–99)
Potassium: 4.5 mmol/L (ref 3.5–5.1)
Sodium: 138 mmol/L (ref 135–145)
Total Bilirubin: 0.5 mg/dL (ref 0.3–1.2)
Total Protein: 7.4 g/dL (ref 6.5–8.1)

## 2019-02-24 LAB — CBC WITH DIFFERENTIAL/PLATELET
Abs Immature Granulocytes: 0.08 10*3/uL — ABNORMAL HIGH (ref 0.00–0.07)
Basophils Absolute: 0 10*3/uL (ref 0.0–0.1)
Basophils Relative: 0 %
Eosinophils Absolute: 0 10*3/uL (ref 0.0–0.5)
Eosinophils Relative: 0 %
HCT: 43 % (ref 39.0–52.0)
Hemoglobin: 14.2 g/dL (ref 13.0–17.0)
Immature Granulocytes: 1 %
Lymphocytes Relative: 8 %
Lymphs Abs: 0.9 10*3/uL (ref 0.7–4.0)
MCH: 29.2 pg (ref 26.0–34.0)
MCHC: 33 g/dL (ref 30.0–36.0)
MCV: 88.3 fL (ref 80.0–100.0)
Monocytes Absolute: 0.8 10*3/uL (ref 0.1–1.0)
Monocytes Relative: 7 %
Neutro Abs: 9.9 10*3/uL — ABNORMAL HIGH (ref 1.7–7.7)
Neutrophils Relative %: 84 %
Platelets: 261 10*3/uL (ref 150–400)
RBC: 4.87 MIL/uL (ref 4.22–5.81)
RDW: 12.8 % (ref 11.5–15.5)
WBC: 11.7 10*3/uL — ABNORMAL HIGH (ref 4.0–10.5)
nRBC: 0 % (ref 0.0–0.2)

## 2019-02-24 LAB — FERRITIN: Ferritin: 193 ng/mL (ref 24–336)

## 2019-02-24 LAB — D-DIMER, QUANTITATIVE: D-Dimer, Quant: 0.7 ug/mL-FEU — ABNORMAL HIGH (ref 0.00–0.50)

## 2019-02-24 LAB — C-REACTIVE PROTEIN: CRP: 20.9 mg/dL — ABNORMAL HIGH (ref <1.0)

## 2019-02-24 MED ORDER — LOSARTAN POTASSIUM 25 MG PO TABS
50.0000 mg | ORAL_TABLET | Freq: Every day | ORAL | Status: DC
  Administered 2019-02-24 – 2019-02-26 (×3): 50 mg via ORAL
  Filled 2019-02-24 (×3): qty 2

## 2019-02-24 NOTE — Progress Notes (Signed)
PROGRESS NOTE  Sean Keller  VZD:[id number] DOB: 01-11-1956 DOA: 02/23/2019 PCP: [facility] Brief Narrative: Sean Keller is a 64 y.o. male with a history of HTN, HLD, hypothyroidism and recent covid-19 diagnosis (2/2) s/p bamlanivimab treatment (2/5) who presented to the ED on 2/7 with worsening cough and shortness of breath, found to be hypoxemic with bilateral lower lobe airspace opacities, CRP elevation (21.1), leukocytosis (10.8), and undetectable procalcitonin. Remdesivir and steroids were started and the patient admitted to [facility].   Assessment & Plan: Active Problems:   Pneumonia due to COVID-19 virus  Acute hypoxemic respiratory failure due to covid-19 pneumonia: SARS-CoV-2 Ag positive on 2/2, received bamlanivimab 2/5.  - Continue remdesivir x5 days 2/7 - 2/11.  - Continue steroids. Inflammatory marker elevation remains severe. - Vitamin C, zinc - Encourage OOB, IS, FV, and awake proning if able - Tylenol and antitussives prn - Continue airborne, contact precautions for 21 days from positive testing. - Check CBC w/diff, CMP, CRP daily - Weight-based enoxaparin prophylactic dose. - Maintain euvolemia/net negative.  - Avoid NSAIDs   HTN:  - Restart home ARB with elevated BPs and normal renal function  HLD:  - Continue statin. LFTs wnl.  Hypothyroidism: Remote TSH wnl.  - Continue synthroid  NSVT: 3 beats asymptomatic on telemetry monitoring.  - Maintain normal electrolytes.  - Consider outpatient monitoring. To facilitate movement, will DC telemetry for now.  Obesity: BMI 30.7. Noted  DVT prophylaxis: Lovenox Code Status: Full Family Communication: None at bedside, pt relayed POC Disposition Plan: Pt from home, independent PTA, will hope for discharge back home once clinically stabilizing. Continues to have severely elevated inflammatory markers and has multiple risk factors for decompensation requiring close monitoring. Can transfer PCU >  med-surg.  Consultants:   None  Procedures:   None  Antimicrobials:  Remdesivir 2/7 - 2/11  Subjective: Shortness of breath and fatigue improved since admission. Still moderately dyspneic worse with exertion. No chest pain or palpitations reported.  Objective: Vitals:   02/23/19 2030 02/24/19 0000 02/24/19 0408 02/24/19 0752  BP:  (!) 147/91 (!) 147/94 (!) 138/96  Pulse:    85  Resp:    17  Temp:  (!) 97.5 F (36.4 C) 98.1 F (36.7 C) 97.6 F (36.4 C)  TempSrc:  Oral Oral Oral  SpO2:  92%  94%  Weight:      Height: 5\' 10"  (1.778 m)       Intake/Output Summary (Last 24 hours) at 02/24/2019 1118 Last data filed at 02/24/2019 1111 Gross per 24 hour  Intake 840 ml  Output 900 ml  Net -60 ml   Filed Weights   02/23/19 1937  Weight: 97.1 kg   Gen: 64 y.o. male in no distress Pulm: Non-labored breathing at rest. Clear to auscultation bilaterally.  CV: Regular rate and rhythm. No murmur, rub, or gallop. No JVD, no pedal edema. GI: Abdomen soft, non-tender, non-distended, with normoactive bowel sounds. No organomegaly or masses felt. Ext: Warm, no deformities Skin: No rashes, lesions or ulcers Neuro: Alert and oriented. No focal neurological deficits. Psych: Judgement and insight appear normal. Mood & affect appropriate.   Data Reviewed: I have personally reviewed following labs and imaging studies  CBC: Recent Labs  Lab 02/18/19 1807 02/23/19 1318 02/24/19 0349  WBC 5.6 10.8* 11.7*  NEUTROABS 3.2 8.9* 9.9*  HGB 15.5 14.7 14.2  HCT 48.3 44.0 43.0  MCV 90.6 88.2 88.3  PLT 221 227 295   Basic Metabolic Panel: Recent Labs  Lab  02/18/19 1807 02/23/19 1318 02/24/19 0349  NA 137 136 138  K 4.1 3.8 4.5  CL 101 101 103  CO2 28 25 25   GLUCOSE 100* 133* 132*  BUN 21 17 23   CREATININE 1.55* 1.10 1.18  CALCIUM 8.7* 8.3* 8.6*   GFR: Estimated Creatinine Clearance: 75.8 mL/min (by C-G formula based on SCr of 1.18 mg/dL). Liver Function Tests: Recent Labs   Lab 02/18/19 1807 02/23/19 1318 02/24/19 0349  AST 19 30 34  ALT 20 31 36  ALKPHOS 57 47 50  BILITOT 0.4 0.5 0.5  PROT 8.0 7.2 7.4  ALBUMIN 4.0 3.3* 3.2*   No results for input(s): LIPASE, AMYLASE in the last 168 hours. No results for input(s): AMMONIA in the last 168 hours. Coagulation Profile: No results for input(s): INR, PROTIME in the last 168 hours. Cardiac Enzymes: No results for input(s): CKTOTAL, CKMB, CKMBINDEX, TROPONINI in the last 168 hours. BNP (last 3 results) No results for input(s): PROBNP in the last 8760 hours. HbA1C: No results for input(s): HGBA1C in the last 72 hours. CBG: No results for input(s): GLUCAP in the last 168 hours. Lipid Profile: Recent Labs    02/23/19 1318  TRIG 96   Thyroid Function Tests: No results for input(s): TSH, T4TOTAL, FREET4, T3FREE, THYROIDAB in the last 72 hours. Anemia Panel: Recent Labs    02/23/19 1318 02/24/19 0349  FERRITIN 185 193   Urine analysis:    Component Value Date/Time   BILIRUBINUR neg 01/13/2013 1218   PROTEINUR trace 01/13/2013 1218   UROBILINOGEN 0.2 01/13/2013 1218   NITRITE neg 01/13/2013 1218   LEUKOCYTESUR Negative 01/13/2013 1218   No results found for this or any previous visit (from the past 240 hour(s)).    Radiology Studies: DG Chest Port 1 View  Result Date: 02/23/2019 CLINICAL DATA:  Shortness of breath, COVID-19 positive. EXAM: PORTABLE CHEST 1 VIEW COMPARISON:  February 18, 2019. FINDINGS: The heart size and mediastinal contours are within normal limits. No pneumothorax or pleural effusion is noted. Bilateral lower lobe airspace opacities are noted consistent with multifocal pneumonia. The visualized skeletal structures are unremarkable. IMPRESSION: Bilateral lower lobe airspace opacities are noted consistent with multifocal pneumonia. Electronically Signed   By: 04/23/2019 M.D.   On: 02/23/2019 13:04    Scheduled Meds: . albuterol  2 puff Inhalation Q6H  . dexamethasone  (DECADRON) injection  6 mg Intravenous Q24H  . docusate sodium  100 mg Oral BID  . enoxaparin (LOVENOX) injection  50 mg Subcutaneous Q24H  . folic acid  1 mg Oral Daily  . levothyroxine  50 mcg Oral Daily  . multivitamin with minerals  1 tablet Oral Daily  . pravastatin  20 mg Oral QHS  . senna  1 tablet Oral BID  . thiamine  100 mg Oral Daily   Continuous Infusions: . remdesivir 100 mg in NS 100 mL 100 mg (02/24/19 1111)     LOS: 1 day   Time spent: 25 minutes.  04/23/2019, MD [facility] [email or web address] 02/24/2019, 11:18 AM

## 2019-02-24 NOTE — Evaluation (Signed)
Physical Therapy Evaluation & Discharge Patient Details Name: Sean Keller MRN: [id number] DOB: May 11, 1955 Today's Date: 02/24/2019   History of Present Illness  Pt is a 64 y.o. male with recent COVID-68 dx (02/18/19) s/p bamlanivimab treatment (02/21/19), now admitted 02/23/19 with worsening cough and SOB. Found to be hypoxemic with bilateral lower lobe airspace opacities, CRP elevation, leukocytosis; remdesivir and steroids were started. PMH includes HTN, HLD, obesity, hypothyroidism.    Clinical Impression  Patient evaluated by Physical Therapy with no further acute PT needs identified. PTA, pt independent, works and lives with wife. Today, pt independent with mobility and ADL tasks. SpO2 >/88% on RA (1x reading briefly down to 86% with unreliable pleth); HR up to 118. Educ re: activity recommendations, seated/standing therex, energy conservation strategies, IS/flutter valve use, importance of mobility. All education has been completed and the patient has no further questions. Acute PT is signing off. Thank you for this referral.    Follow Up Recommendations No PT follow up    Equipment Recommendations  None recommended by PT    Recommendations for Other Services       Precautions / Restrictions Precautions Precautions: None Restrictions Weight Bearing Restrictions: No      Mobility  Bed Mobility Overal bed mobility: Independent                Transfers Overall transfer level: Independent Equipment used: None                Ambulation/Gait Ambulation/Gait assistance: Independent Gait Distance (Feet): 600 Feet Assistive device: None Gait Pattern/deviations: WFL(Within Functional Limits)   Gait velocity interpretation: >2.62 ft/sec, indicative of community ambulatory General Gait Details: SpO2 down to 86% on RA, quick to return to 88-94% without seated rest. No DOE noted, pt denies SOB  Stairs            Wheelchair Mobility    Modified Rankin (Stroke  Patients Only)       Balance Overall balance assessment: Independent             Standing balance comment: Prolonged standing at sink performing ADL tasks (upper/lower body wash up, shaving, brushing teeth) independently             High level balance activites: Backward walking;Side stepping;Direction changes;Turns;Sudden stops;Head turns High Level Balance Comments: No instability or LOB with higher level balance activities             Pertinent Vitals/Pain Pain Assessment: No/denies pain    Home Living Family/patient expects to be discharged to:: Private residence Living Arrangements: Spouse/significant other Available Help at Discharge: Family;Available 24 hours/day Type of Home: House Home Access: Stairs to enter Entrance Stairs-Rails: Right Entrance Stairs-Number of Steps: 4 Home Layout: Two level        Prior Function Level of Independence: Independent         Comments: Works as Scientist, physiological, on his feet majority of day. For exercise, enjoys some walking and lifting light weights     Hand Dominance        Extremity/Trunk Assessment   Upper Extremity Assessment Upper Extremity Assessment: Overall WFL for tasks assessed    Lower Extremity Assessment Lower Extremity Assessment: Overall WFL for tasks assessed    Cervical / Trunk Assessment Cervical / Trunk Assessment: Normal  Communication   Communication: No difficulties  Cognition Arousal/Alertness: Awake/alert Behavior During Therapy: WFL for tasks assessed/performed Overall Cognitive Status: Within Functional Limits for tasks assessed  General Comments: WFL for simple tasks, not formally assessed      General Comments      Exercises Other Exercises Other Exercises: Educ on seated/standing therex within confines of lines/leads - LAQ, seated marching, standing marching, repeated sit<>stands, walking back/forth around bed frame Other  Exercises: Good technique with flutter valve and IS, reinforced frequency   Assessment/Plan    PT Assessment Patent does not need any further PT services  PT Problem List         PT Treatment Interventions      PT Goals (Current goals can be found in the Care Plan section)  Acute Rehab PT Goals PT Goal Formulation: All assessment and education complete, DC therapy    Frequency     Barriers to discharge        Co-evaluation               AM-PAC PT "6 Clicks" Mobility  Outcome Measure Help needed turning from your back to your side while in a flat bed without using bedrails?: None Help needed moving from lying on your back to sitting on the side of a flat bed without using bedrails?: None Help needed moving to and from a bed to a chair (including a wheelchair)?: None Help needed standing up from a chair using your arms (e.g., wheelchair or bedside chair)?: None Help needed to walk in hospital room?: None Help needed climbing 3-5 steps with a railing? : None 6 Click Score: 24    End of Session   Activity Tolerance: Patient tolerated treatment well Patient left: in chair;with call bell/phone within reach Nurse Communication: Mobility status PT Visit Diagnosis: Other abnormalities of gait and mobility (R26.89)    Time: 6599-3570 PT Time Calculation (min) (ACUTE ONLY): 32 min   Charges:   PT Evaluation $PT Eval Low Complexity: 1 Low PT Treatments $Therapeutic Exercise: 8-22 mins      Ina Homes, PT, DPT Acute Rehabilitation Services  Pager [phone] Office [phone]  Malachy Chamber 02/24/2019, 4:26 PM

## 2019-02-24 NOTE — Plan of Care (Signed)
Received call from tele monitor tech that patient had 3 beat of V-Tach. Patient denies any complains at present.

## 2019-02-24 NOTE — Plan of Care (Signed)
Pt updated family via cell phone. Per pt, family has no questions or concerns at this time. VSS on RA. Remdesivir dose 2 of 5 given. Will continue POC.  Problem: Education: Goal: Knowledge of risk factors and measures for prevention of condition will improve Outcome: Progressing   Problem: Coping: Goal: Psychosocial and spiritual needs will be supported Outcome: Progressing   Problem: Respiratory: Goal: Will maintain a patent airway Outcome: Progressing Goal: Complications related to the disease process, condition or treatment will be avoided or minimized Outcome: Progressing   Problem: Education: Goal: Knowledge of General Education information will improve Description: Including pain rating scale, medication(s)/side effects and non-pharmacologic comfort measures Outcome: Progressing   Problem: Health Behavior/Discharge Planning: Goal: Ability to manage health-related needs will improve Outcome: Progressing   Problem: Clinical Measurements: Goal: Ability to maintain clinical measurements within normal limits will improve Outcome: Progressing Goal: Will remain free from infection Outcome: Progressing Goal: Diagnostic test results will improve Outcome: Progressing Goal: Respiratory complications will improve Outcome: Progressing Goal: Cardiovascular complication will be avoided Outcome: Progressing   Problem: Activity: Goal: Risk for activity intolerance will decrease Outcome: Progressing   Problem: Nutrition: Goal: Adequate nutrition will be maintained Outcome: Progressing   Problem: Coping: Goal: Level of anxiety will decrease Outcome: Progressing   Problem: Elimination: Goal: Will not experience complications related to bowel motility Outcome: Progressing Goal: Will not experience complications related to urinary retention Outcome: Progressing   Problem: Pain Managment: Goal: General experience of comfort will improve Outcome: Progressing   Problem:  Safety: Goal: Ability to remain free from injury will improve Outcome: Progressing   Problem: Skin Integrity: Goal: Risk for impaired skin integrity will decrease Outcome: Progressing

## 2019-02-25 LAB — CBC WITH DIFFERENTIAL/PLATELET
Abs Immature Granulocytes: 0.12 10*3/uL — ABNORMAL HIGH (ref 0.00–0.07)
Basophils Absolute: 0 10*3/uL (ref 0.0–0.1)
Basophils Relative: 0 %
Eosinophils Absolute: 0 10*3/uL (ref 0.0–0.5)
Eosinophils Relative: 0 %
HCT: 42 % (ref 39.0–52.0)
Hemoglobin: 13.9 g/dL (ref 13.0–17.0)
Immature Granulocytes: 1 %
Lymphocytes Relative: 12 %
Lymphs Abs: 1.4 10*3/uL (ref 0.7–4.0)
MCH: 29 pg (ref 26.0–34.0)
MCHC: 33.1 g/dL (ref 30.0–36.0)
MCV: 87.5 fL (ref 80.0–100.0)
Monocytes Absolute: 1 10*3/uL (ref 0.1–1.0)
Monocytes Relative: 9 %
Neutro Abs: 9.3 10*3/uL — ABNORMAL HIGH (ref 1.7–7.7)
Neutrophils Relative %: 78 %
Platelets: 307 10*3/uL (ref 150–400)
RBC: 4.8 MIL/uL (ref 4.22–5.81)
RDW: 12.9 % (ref 11.5–15.5)
WBC: 11.9 10*3/uL — ABNORMAL HIGH (ref 4.0–10.5)
nRBC: 0 % (ref 0.0–0.2)

## 2019-02-25 LAB — COMPREHENSIVE METABOLIC PANEL
ALT: 61 U/L — ABNORMAL HIGH (ref 0–44)
AST: 48 U/L — ABNORMAL HIGH (ref 15–41)
Albumin: 3.1 g/dL — ABNORMAL LOW (ref 3.5–5.0)
Alkaline Phosphatase: 49 U/L (ref 38–126)
Anion gap: 8 (ref 5–15)
BUN: 30 mg/dL — ABNORMAL HIGH (ref 8–23)
CO2: 24 mmol/L (ref 22–32)
Calcium: 8.6 mg/dL — ABNORMAL LOW (ref 8.9–10.3)
Chloride: 107 mmol/L (ref 98–111)
Creatinine, Ser: 1.29 mg/dL — ABNORMAL HIGH (ref 0.61–1.24)
GFR calc Af Amer: >60 mL/min (ref >60)
GFR calc non Af Amer: 59 mL/min — ABNORMAL LOW (ref >60)
Glucose, Bld: 140 mg/dL — ABNORMAL HIGH (ref 70–99)
Potassium: 4.3 mmol/L (ref 3.5–5.1)
Sodium: 139 mmol/L (ref 135–145)
Total Bilirubin: 0.4 mg/dL (ref 0.3–1.2)
Total Protein: 7 g/dL (ref 6.5–8.1)

## 2019-02-25 LAB — C-REACTIVE PROTEIN: CRP: 10.2 mg/dL — ABNORMAL HIGH (ref <1.0)

## 2019-02-25 LAB — D-DIMER, QUANTITATIVE: D-Dimer, Quant: 0.5 ug{FEU}/mL (ref 0.00–0.50)

## 2019-02-25 NOTE — Progress Notes (Signed)
Occupational Therapy Evaluation/Discharge Patient Details Name: Sean Keller MRN: [id number] DOB: [birth year] Today's Date: 02/25/2019    History of Present Illness Pt is a 64 y.o. male with recent COVID-47 dx (02/18/19) s/p bamlanivimab treatment (02/21/19), now admitted 02/23/19 with worsening cough and SOB. Found to be hypoxemic with bilateral lower lobe airspace opacities, CRP elevation, leukocytosis; remdesivir and steroids were started. PMH includes HTN, HLD, obesity, hypothyroidism.   Clinical Impression   Patient very pleasant and hard working.  He is independent at prior level and lives at home with spouse. Patient remained on room air throughout session and SpO2> 97 even during mobility.  Walked 200 ft independently and completed standing ADLs independently.  Reviewed and practiced flutter valve and incentive spirometer (pulled 2000).  Discussed energy conservation strategies to employ at home if needed.  Patient does not require any additional OT services at this time.  Patient safe to return home without OT follow up.    Follow Up Recommendations  No OT follow up    Equipment Recommendations    None   Recommendations for Other Services       Precautions / Restrictions Precautions Precautions: None Restrictions Weight Bearing Restrictions: No      Mobility Bed Mobility Overal bed mobility: Independent                Transfers Overall transfer level: Independent Equipment used: None                  Balance Overall balance assessment: Independent                                         ADL either performed or assessed with clinical judgement   ADL Overall ADL's : Independent                                             Vision         Perception     Praxis      Pertinent Vitals/Pain       Hand Dominance     Extremity/Trunk Assessment Upper Extremity Assessment Upper Extremity Assessment: Overall WFL  for tasks assessed           Communication Communication Communication: No difficulties   Cognition Arousal/Alertness: Awake/alert Behavior During Therapy: WFL for tasks assessed/performed Overall Cognitive Status: Within Functional Limits for tasks assessed                                 General Comments: WFL for simple tasks, not formally assessed   General Comments       Exercises Exercises: Other exercises Other Exercises Other Exercises: Walked 257ft Other Exercises: x10 flutter valve Other Exercises: x10 incentive spirometer   Shoulder Instructions      Home Living Family/patient expects to be discharged to:: Private residence Living Arrangements: Spouse/significant other Available Help at Discharge: Family;Available 24 hours/day Type of Home: House Home Access: Stairs to enter [employer] of Steps: 4 Entrance Stairs-Rails: Right Home Layout: Two level     Bathroom Shower/Tub: Teacher, early years/pre: Standard     Home Equipment: None          Prior Functioning/Environment Level of Independence:  Independent        Comments: Works as Investment banker, operational, on his Presenter, broadcasting of day. For exercise, enjoys some walking and lifting light weights        OT Problem List: Cardiopulmonary status limiting activity      OT Treatment/Interventions:      OT Goals(Current goals can be found in the care plan section) Acute Rehab OT Goals Patient Stated Goal: Go home OT Goal Formulation: With patient Time For Goal Achievement: 03/11/19 Potential to Achieve Goals: Good  OT Frequency: Other (comment)(d/c orders)   Barriers to D/C:            Co-evaluation              AM-PAC OT "6 Clicks" Daily Activity     Outcome Measure Help from another person eating meals?: None Help from another person taking care of personal grooming?: None Help from another person toileting, which includes using toliet, bedpan, or urinal?:  None Help from another person bathing (including washing, rinsing, drying)?: None Help from another person to put on and taking off regular upper body clothing?: None Help from another person to put on and taking off regular lower body clothing?: None 6 Click Score: 24   End of Session Nurse Communication: Mobility status  Activity Tolerance: Patient tolerated treatment well Patient left: in chair;with call bell/phone within reach  OT Visit Diagnosis: Other (comment)(Cardiopulminary limitations)                Time: 7124-5809 OT Time Calculation (min): 25 min Charges:  OT General Charges $OT Visit: 1 Visit OT Evaluation $OT Eval Moderate Complexity: 1 Mod OT Treatments $Therapeutic Activity: 8-22 mins  Barbie Banner, OTR/L   Adella Hare 02/25/2019, 12:53 PM

## 2019-02-25 NOTE — Progress Notes (Signed)
PROGRESS NOTE  Sean Keller  HGD:[id number] DOB: [birth year] DOA: 02/23/2019 PCP: [facility] Brief Narrative: Sean Keller is a 64 y.o. male with a history of HTN, HLD, hypothyroidism and recent covid-19 diagnosis (2/2) s/p bamlanivimab treatment (2/5) who presented to the ED on 2/7 with worsening cough and shortness of breath, found to be hypoxemic with bilateral lower lobe airspace opacities, CRP elevation (21.1), leukocytosis (10.8), and undetectable procalcitonin. Remdesivir and steroids were started and the patient admitted to [facility].   Assessment & Plan: Active Problems:   Pneumonia due to COVID-19 virus  Acute hypoxemic respiratory failure due to covid-19 pneumonia: SARS-CoV-2 Ag positive on 2/2, received bamlanivimab 2/5.  - Continue remdesivir x5 days 2/7 - 2/11.  - Continue steroids. Inflammatory marker elevation starting to improve. - Vitamin C, zinc - Encourage OOB, IS, FV, and awake proning if able - Tylenol and antitussives prn - Continue airborne, contact precautions for 21 days from positive testing. - Check CBC w/diff, CMP, CRP daily - Weight-based enoxaparin prophylactic dose. - Maintain euvolemia/net negative.  - Avoid NSAIDs   HTN:  - Restart home ARB with elevated BPs and normal renal function  HLD:  - Continue statin.   LFT elevation: Starting 2/9 in the midst of remdesivir Tx.  - Will continue remdesivir and monitor CMP in AM.   Hypothyroidism: Remote TSH wnl.  - Continue synthroid  NSVT: 3 beats asymptomatic on telemetry monitoring.  - Maintain normal electrolytes.  - Consider outpatient monitoring. To facilitate movement, telemetry discontinued.  Obesity: BMI 30.7. Noted  DVT prophylaxis: Lovenox Code Status: Full Family Communication: None at bedside, pt relayed POC Disposition Plan: Hopeful for return home 2/10 if clinically improved and LFTs stable with plans to complete remdesivir in infusion clinic 2/11.   Consultants:   None   Procedures:   None  Antimicrobials:  Remdesivir 2/7 - 2/11  Subjective: This morning had an abrupt sensation of inability to catch a deep breath which was severely troubling, not associated with chest pain. This remained constant for several minutes but abated prior to calling RN. Feels shortness of breath has otherwise on the whole significantly improved.   Objective: Vitals:   02/24/19 1504 02/24/19 1900 02/25/19 0333 02/25/19 0716  BP: (!) 159/90 140/83 133/85 126/86  Pulse: 72 77  66  Resp: (!) 22 20 20 17   Temp: 98.6 F (37 C) 98.7 F (37.1 C) 99.1 F (37.3 C) 97.7 F (36.5 C)  TempSrc: Oral Oral Oral Oral  SpO2: 95% 92%  95%  Weight:      Height:        Intake/Output Summary (Last 24 hours) at 02/25/2019 1500 Last data filed at 02/25/2019 1100 Gross per 24 hour  Intake 720 ml  Output 500 ml  Net 220 ml   Filed Weights   02/23/19 1937  Weight: 97.1 kg   Gen: 64 y.o. male in no distress Pulm: Nonlabored breathing room air. Clear. CV: Regular rate and rhythm. No murmur, rub, or gallop. No JVD, no dependent edema. GI: Abdomen soft, non-tender, non-distended, with normoactive bowel sounds.  Ext: Warm, no deformities Skin: No rashes, lesions or ulcers on visualized skin. Neuro: Alert and oriented. No focal neurological deficits. Psych: Judgement and insight appear fair. Mood euthymic & affect congruent. Behavior is appropriate.    Data Reviewed: I have personally reviewed following labs and imaging studies  CBC: Recent Labs  Lab 02/18/19 1807 02/23/19 1318 02/24/19 0349 02/25/19 0430  WBC 5.6 10.8* 11.7* 11.9*  NEUTROABS 3.2  8.9* 9.9* 9.3*  HGB 15.5 14.7 14.2 13.9  HCT 48.3 44.0 43.0 42.0  MCV 90.6 88.2 88.3 87.5  PLT 221 227 261 937   Basic Metabolic Panel: Recent Labs  Lab 02/18/19 1807 02/23/19 1318 02/24/19 0349 02/25/19 0430  NA 137 136 138 139  K 4.1 3.8 4.5 4.3  CL 101 101 103 107  CO2 28 25 25 24   GLUCOSE 100* 133* 132* 140*  BUN 21  17 23  30*  CREATININE 1.55* 1.10 1.18 1.29*  CALCIUM 8.7* 8.3* 8.6* 8.6*   Liver Function Tests: Recent Labs  Lab 02/18/19 1807 02/23/19 1318 02/24/19 0349 02/25/19 0430  AST 19 30 34 48*  ALT 20 31 36 61*  ALKPHOS 57 47 50 49  BILITOT 0.4 0.5 0.5 0.4  PROT 8.0 7.2 7.4 7.0  ALBUMIN 4.0 3.3* 3.2* 3.1*   Anemia Panel: Recent Labs    02/23/19 1318 02/24/19 0349  FERRITIN 185 193   Urine analysis:    Component Value Date/Time   BILIRUBINUR neg 01/13/2013 1218   PROTEINUR trace 01/13/2013 1218   UROBILINOGEN 0.2 01/13/2013 1218   NITRITE neg 01/13/2013 1218   LEUKOCYTESUR Negative 01/13/2013 1218   Recent Results (from the past 240 hour(s))  Blood Culture (routine x 2)     Status: None (Preliminary result)   Collection Time: 02/23/19  1:18 PM   Specimen: BLOOD LEFT FOREARM  Result Value Ref Range Status   Specimen Description   Final    BLOOD LEFT FOREARM Performed at [facility], [place] [address], [place], [place] [address]    Special Requests   Final    BOTTLES DRAWN AEROBIC AND ANAEROBIC Blood Culture adequate volume Performed at [facility] [address], [place], [place] [address]    Culture   Final    NO GROWTH 2 DAYS Performed at [facility] [address] [place], [place] [address]    Report Status PENDING  Incomplete  Blood Culture (routine x 2)     Status: None (Preliminary result)   Collection Time: 02/23/19  1:18 PM   Specimen: BLOOD LEFT FOREARM  Result Value Ref Range Status   Specimen Description   Final    BLOOD LEFT FOREARM Performed at [facility] [address], [place], [place] [address]    Special Requests   Final    BOTTLES DRAWN AEROBIC AND ANAEROBIC Blood Culture adequate volume Performed at [facility] [address] [place], [place] [address]    Culture   Final    NO GROWTH 2 DAYS Performed at [facility] [address]  [place], [place] [address]    Report Status PENDING  Incomplete      Radiology Studies: No results found.  Scheduled Meds: . albuterol  2 puff Inhalation Q6H  . dexamethasone (DECADRON) injection  6 mg Intravenous Q24H  . docusate sodium  100 mg Oral BID  . enoxaparin (LOVENOX) injection  50 mg Subcutaneous Q24H  . folic acid  1 mg Oral Daily  . levothyroxine  50 mcg Oral Daily  . losartan  50 mg Oral Daily  . multivitamin with minerals  1 tablet Oral Daily  . pravastatin  20 mg Oral QHS  . senna  1 tablet Oral BID  . thiamine  100 mg Oral Daily   Continuous Infusions: . remdesivir 100 mg in NS 100 mL 100 mg (02/25/19 1015)     LOS: 2 days  Time spent: 25 minutes.  Tyrone Nine, MD [facility] [email or web address] 02/25/2019, 3:00 PM

## 2019-02-25 NOTE — Plan of Care (Signed)
RN updated wife, Wille Celeste. VSS on RA. Pt receiving remdesivir dose 3 of 5. Pt reported episode of SOB o/n and instructed to notify RN ASAP if it happens again as pt is non-monitored. Will continue POC.  Problem: Education: Goal: Knowledge of risk factors and measures for prevention of condition will improve Outcome: Progressing   Problem: Coping: Goal: Psychosocial and spiritual needs will be supported Outcome: Progressing   Problem: Respiratory: Goal: Will maintain a patent airway Outcome: Progressing Goal: Complications related to the disease process, condition or treatment will be avoided or minimized Outcome: Progressing   Problem: Education: Goal: Knowledge of General Education information will improve Description: Including pain rating scale, medication(s)/side effects and non-pharmacologic comfort measures Outcome: Progressing   Problem: Health Behavior/Discharge Planning: Goal: Ability to manage health-related needs will improve Outcome: Progressing   Problem: Clinical Measurements: Goal: Ability to maintain clinical measurements within normal limits will improve Outcome: Progressing Goal: Will remain free from infection Outcome: Progressing Goal: Diagnostic test results will improve Outcome: Progressing Goal: Respiratory complications will improve Outcome: Progressing Goal: Cardiovascular complication will be avoided Outcome: Progressing   Problem: Activity: Goal: Risk for activity intolerance will decrease Outcome: Progressing   Problem: Nutrition: Goal: Adequate nutrition will be maintained Outcome: Progressing   Problem: Coping: Goal: Level of anxiety will decrease Outcome: Progressing   Problem: Elimination: Goal: Will not experience complications related to bowel motility Outcome: Progressing Goal: Will not experience complications related to urinary retention Outcome: Progressing   Problem: Pain Managment: Goal: General experience of comfort will  improve Outcome: Progressing   Problem: Safety: Goal: Ability to remain free from injury will improve Outcome: Progressing   Problem: Skin Integrity: Goal: Risk for impaired skin integrity will decrease Outcome: Progressing

## 2019-02-26 DIAGNOSIS — E039 Hypothyroidism, unspecified: Secondary | ICD-10-CM

## 2019-02-26 DIAGNOSIS — E669 Obesity, unspecified: Secondary | ICD-10-CM

## 2019-02-26 DIAGNOSIS — J9601 Acute respiratory failure with hypoxia: Secondary | ICD-10-CM | POA: Diagnosis present

## 2019-02-26 DIAGNOSIS — I129 Hypertensive chronic kidney disease with stage 1 through stage 4 chronic kidney disease, or unspecified chronic kidney disease: Secondary | ICD-10-CM | POA: Diagnosis present

## 2019-02-26 DIAGNOSIS — N182 Chronic kidney disease, stage 2 (mild): Secondary | ICD-10-CM

## 2019-02-26 LAB — COMPREHENSIVE METABOLIC PANEL
ALT: 69 U/L — ABNORMAL HIGH (ref 0–44)
AST: 43 U/L — ABNORMAL HIGH (ref 15–41)
Albumin: 3 g/dL — ABNORMAL LOW (ref 3.5–5.0)
Alkaline Phosphatase: 47 U/L (ref 38–126)
Anion gap: 6 (ref 5–15)
BUN: 27 mg/dL — ABNORMAL HIGH (ref 8–23)
CO2: 24 mmol/L (ref 22–32)
Calcium: 8.4 mg/dL — ABNORMAL LOW (ref 8.9–10.3)
Chloride: 108 mmol/L (ref 98–111)
Creatinine, Ser: 1.34 mg/dL — ABNORMAL HIGH (ref 0.61–1.24)
GFR calc Af Amer: >60 mL/min (ref >60)
GFR calc non Af Amer: 56 mL/min — ABNORMAL LOW (ref >60)
Glucose, Bld: 108 mg/dL — ABNORMAL HIGH (ref 70–99)
Potassium: 4.7 mmol/L (ref 3.5–5.1)
Sodium: 138 mmol/L (ref 135–145)
Total Bilirubin: 0.4 mg/dL (ref 0.3–1.2)
Total Protein: 6.9 g/dL (ref 6.5–8.1)

## 2019-02-26 LAB — C-REACTIVE PROTEIN: CRP: 4.9 mg/dL — ABNORMAL HIGH (ref <1.0)

## 2019-02-26 MED ORDER — PREDNISONE 10 MG PO TABS: ORAL_TABLET | ORAL | 0 refills | Status: AC

## 2019-02-26 MED ORDER — SODIUM CHLORIDE 0.9 % IV SOLN: INTRAVENOUS | Status: DC

## 2019-02-26 NOTE — Progress Notes (Signed)
Patient scheduled for outpatient Remdesivir infusion at 10:00 AM on Thursday 2/11.  Please advise them to report to [facility] at [address].  Drive to the security guard and tell them you are here for an infusion. They will direct you to the front entrance where we will come and get you.  For questions call [phone].  Thanks   

## 2019-02-26 NOTE — Progress Notes (Signed)
1400 Per Dr discharging pt still plan for today.  Pt VSS No distress noted.

## 2019-02-26 NOTE — Discharge Instructions (Addendum)
You are scheduled for an outpatient infusion of Remdesivir at 10:00 AM on Thursday 2/11.  Please report to [facility] at [address].  Drive to the security guard and tell them you are here for an infusion. They will direct you to the front entrance where we will come and get you.  For questions call [phone].  Thanks   Stay in isolation until 2/23.    Ok to get the vaccine after 5/3.

## 2019-02-26 NOTE — Discharge Summary (Signed)
Discharge Summary  Sean Keller DEY:[id number] DOB: [birth year]  PCP: [facility]  Admit date: 02/23/2019 Discharge date: 02/26/2019  Time spent: 35 minutes   Recommendations for Outpatient Follow-up:  1. Patient will follow up with his PCP in approximately 6 weeks. 2. Patient will follow up with the IV infusion center as an outpatient on 2/11 at 10 AM to get his final dose of Remdisivir 3. New medication: 5-day prednisone taper 4. Patient will stay in isolation until 2/23, 21 days after his positive Covid test. 5. Patient is advised to get the Covid vaccine when he is able, but no earlier than 5/3, 90 days after his positive Covid test since he had been on IV monoclonal antibody treatment  Discharge Diagnoses:  Active Hospital Problems   Diagnosis Date Noted  . Acute respiratory failure with hypoxia (HCC)   . Hypothyroidism 02/26/2019  . Benign hypertension with CKD (chronic kidney disease), stage II   . Obesity (BMI 30-39.9)   . Pneumonia due to COVID-19 virus 02/23/2019  . Hyperlipidemia 01/13/2013    Resolved Hospital Problems  No resolved problems to display.    Discharge Condition: Improved, being discharged home  Diet recommendation: Low-sodium  Vitals:   02/26/19 0303 02/26/19 0800  BP: 139/81 140/82  Pulse: 60 76  Resp: 20 18  Temp: 98.2 F (36.8 C) 98 F (36.7 C)  SpO2: 97% 95%    History of present illness:  64 year old male with past medical history of hypertension, stage II chronic kidney disease and hypothyroidism who was diagnosed with Covid on 2/2 and underwent IV antibody treatment on 2/5, but then presented to the emergency room on 2/7 with worsening cough and shortness of breath and found to be acutely hypoxic with bilateral lower lobe opacities and a CRP elevation of 21.  Patient was started on IV at Remdisivir and steroids.  Hospital Course:  Principal Problem:   Acute respiratory failure with hypoxia (HCC) secondary to COVID-19:  Patient responded well to IV Remdisivir.  By 2/10, after completing 4 days of therapy, he was able to be weaned off and on room air.  He will be discharged on 2/10 and follow-up at the infusion center as an outpatient on 2/11 for his final dose of IV Remdisivir.  His CRP level is down to 4.9 on day of discharge.  He will be discharged on 5 days of a prednisone taper.  Active Problems:   Hyperlipidemia: Stable, continue statin    Benign hypertension with CKD (chronic kidney disease), stage II: Renal function overall has remained stable with a GFR staying above 60 during his hospitalization.  Creatinine on day of discharge at 1.34    Obesity (BMI 30-39.9): Patient meets criteria BMI greater than 30.    Hypothyroidism: Stable, continue Synthroid.  Procedures:  None  Consultations:  None  Discharge Exam: BP 140/82   Pulse 76   Temp 98 F (36.7 C) (Oral)   Resp 18   Ht 5\' 10"  (1.778 m)   Wt 97.1 kg   SpO2 95%   BMI 30.72 kg/m   General: Alert and oriented x3, no acute distress Cardiovascular: Regular rate and rhythm, S1-S2 Respiratory: Clear to auscultation bilaterally  Discharge Instructions You were cared for by a hospitalist during your hospital stay. If you have any questions about your discharge medications or the care you received while you were in the hospital after you are discharged, you can call the unit and asked to speak with the hospitalist on call if the  hospitalist that took care of you is not available. Once you are discharged, your primary care physician will handle any further medical issues. Please note that NO REFILLS for any discharge medications will be authorized once you are discharged, as it is imperative that you return to your primary care physician (or establish a relationship with a primary care physician if you do not have one) for your aftercare needs so that they can reassess your need for medications and monitor your lab values.   Allergies as of  02/26/2019   No Known Allergies     Medication List    STOP taking these medications   meloxicam 15 MG tablet Commonly known as: MOBIC     TAKE these medications   fluticasone 50 MCG/ACT nasal spray Commonly known as: FLONASE USE TWO SPRAYS IN EACH NOSTRIL EVERY DAY AS NEEDED What changed:   how much to take  how to take this  when to take this  reasons to take this  additional instructions   levothyroxine 50 MCG tablet Commonly known as: SYNTHROID Take 50 mcg by mouth daily.   losartan 50 MG tablet Commonly known as: COZAAR Take 50 mg by mouth daily.   MULTIVITAMINS PO Take 1 tablet by mouth daily.   pravastatin 20 MG tablet Commonly known as: PRAVACHOL TAKE 1 TABLET BY MOUTH EVERY NIGHT What changed: when to take this   predniSONE 10 MG tablet Commonly known as: DELTASONE Take 5 tablets (50 mg total) by mouth daily with breakfast for 1 day, THEN 4 tablets (40 mg total) daily with breakfast for 1 day, THEN 3 tablets (30 mg total) daily with breakfast for 1 day, THEN 2 tablets (20 mg total) daily with breakfast for 1 day, THEN 1 tablet (10 mg total) daily with breakfast for 1 day. Start taking on: February 26, 2019   Viagra 100 MG tablet Generic drug: sildenafil TAKE 1 TABLET BY MOUTH EVERY DAY AS NEEDED What changed:   how much to take  when to take this  reasons to take this   Vitamin D (Ergocalciferol) 1.25 MG (50000 UNIT) Caps capsule Commonly known as: DRISDOL Take 50,000 Units by mouth once a week.      No Known Allergies Follow-up Information    [facility] Rem[facility] Follow up on 02/27/2019.   Why: Drive to the security guard and tell them you are here for an infusion. They will direct you to the front entrance where we will come and get you.  For questions call [phone] Contact information: [place]  [address]         [facility] Follow up in 6 week(s).   Contact information: [address] [place] [address] [phone]            The results of significant diagnostics from this hospitalization (including imaging, microbiology, ancillary and laboratory) are listed below for reference.    Significant Diagnostic Studies: DG Chest Port 1 View  Result Date: 02/23/2019 CLINICAL DATA:  Shortness of breath, COVID-19 positive. EXAM: PORTABLE CHEST 1 VIEW COMPARISON:  February 18, 2019. FINDINGS: The heart size and mediastinal contours are within normal limits. No pneumothorax or pleural effusion is noted. Bilateral lower lobe airspace opacities are noted consistent with multifocal pneumonia. The visualized skeletal structures are unremarkable. IMPRESSION: Bilateral lower lobe airspace opacities are noted consistent with multifocal pneumonia. Electronically Signed   By: Marijo Conception M.D.   On: 02/23/2019 13:04   DG  Chest Portable 1 View  Result Date: 02/18/2019 CLINICAL DATA:  Cough, fever, and chills for 5 days. EXAM: PORTABLE CHEST 1 VIEW COMPARISON:  None. FINDINGS: The heart size and mediastinal contours are within normal limits. Linear opacity in left lung base may be due to mild scarring or atelectasis. No evidence of pulmonary infiltrate or edema. No evidence of pleural effusion. The visualized skeletal structures are unremarkable. IMPRESSION: No active disease. Electronically Signed   By: Danae Orleans M.D.   On: 02/18/2019 18:14    Microbiology: Recent Results (from the past 240 hour(s))  Blood Culture (routine x 2)     Status: None (Preliminary result)   Collection Time: 02/23/19  1:18 PM   Specimen: BLOOD LEFT FOREARM  Result Value Ref Range Status   Specimen Description   Final    BLOOD LEFT FOREARM Performed at [facility], [address], [place], [place] [address]    Special Requests   Final    BOTTLES DRAWN AEROBIC AND ANAEROBIC Blood Culture adequate volume Performed at [facility], [address],  [place], [place] [address]    Culture   Final    NO GROWTH 3 DAYS Performed at [facility], [address] [place], [place] [address]    Report Status PENDING  Incomplete  Blood Culture (routine x 2)     Status: None (Preliminary result)   Collection Time: 02/23/19  1:18 PM   Specimen: BLOOD LEFT FOREARM  Result Value Ref Range Status   Specimen Description   Final    BLOOD LEFT FOREARM Performed at [facility], [address] [place], [place] [address]    Special Requests   Final    BOTTLES DRAWN AEROBIC AND ANAEROBIC Blood Culture adequate volume Performed at [facility] [place] [facility], [address], [place], [place] [address]    Culture   Final    NO GROWTH 3 DAYS Performed at [facility], [address], [place], [place] [address]    Report Status PENDING  Incomplete     Labs: Basic Metabolic Panel: Recent Labs  Lab 02/23/19 1318 02/24/19 0349 02/25/19 0430 02/26/19 0604  NA 136 138 139 138  K 3.8 4.5 4.3 4.7  CL 101 103 107 108  CO2 25 25 24 24   GLUCOSE 133* 132* 140* 108*  BUN 17 23 30* 27*  CREATININE 1.10 1.18 1.29* 1.34*  CALCIUM 8.3* 8.6* 8.6* 8.4*   Liver Function Tests: Recent Labs  Lab 02/23/19 1318 02/24/19 0349 02/25/19 0430 02/26/19 0604  AST 30 34 48* 43*  ALT 31 36 61* 69*  ALKPHOS 47 50 49 47  BILITOT 0.5 0.5 0.4 0.4  PROT 7.2 7.4 7.0 6.9  ALBUMIN 3.3* 3.2* 3.1* 3.0*   No results for input(s): LIPASE, AMYLASE in the last 168 hours. No results for input(s): AMMONIA in the last 168 hours. CBC: Recent Labs  Lab 02/23/19 1318 02/24/19 0349 02/25/19 0430  WBC 10.8* 11.7* 11.9*  NEUTROABS 8.9* 9.9* 9.3*  HGB 14.7 14.2 13.9  HCT 44.0 43.0 42.0  MCV 88.2 88.3 87.5  PLT 227 261 307   Cardiac Enzymes: No results for input(s): CKTOTAL, CKMB, CKMBINDEX, TROPONINI in the last 168 hours. BNP: BNP (last 3 results) No results for input(s): BNP in the last 8760 hours.  ProBNP (last 3 results) No  results for input(s): PROBNP in the last 8760 hours.  CBG: No results for input(s): GLUCAP in the last 168 hours.  Signed:  Hollice Espy, MD [facility] 02/26/2019, 2:23 PM

## 2019-02-26 NOTE — Progress Notes (Signed)
1830Pt verbalized understanding of discharge instructions.  Pt IV removed cannula intact.  Pt VSS.  Pt reports no CP or SOB

## 2019-02-27 ENCOUNTER — Ambulatory Visit (HOSPITAL_COMMUNITY)
Admission: RE | Admit: 2019-02-27 | Discharge: 2019-02-27 | Disposition: A | Discharge status: Home / Self Care | Payer: 59 | Source: Ambulatory Visit | Attending: Pulmonary Disease | Admitting: Pulmonary Disease

## 2019-02-27 DIAGNOSIS — J1282 Pneumonia due to coronavirus disease 2019: Secondary | ICD-10-CM | POA: Insufficient documentation

## 2019-02-27 DIAGNOSIS — U071 COVID-19: Secondary | ICD-10-CM | POA: Diagnosis present

## 2019-02-27 MED ORDER — EPINEPHRINE 0.3 MG/0.3ML IJ SOAJ: 0.3000 mg | Freq: Once | INTRAMUSCULAR | Status: DC | PRN

## 2019-02-27 MED ORDER — METHYLPREDNISOLONE SODIUM SUCC 125 MG IJ SOLR: 125.0000 mg | Freq: Once | INTRAMUSCULAR | Status: DC | PRN

## 2019-02-27 MED ORDER — SODIUM CHLORIDE 0.9 % IV SOLN: INTRAVENOUS | Status: DC | PRN

## 2019-02-27 MED ORDER — FAMOTIDINE IN NACL 20-0.9 MG/50ML-% IV SOLN: 20.0000 mg | Freq: Once | INTRAVENOUS | Status: DC | PRN

## 2019-02-27 MED ORDER — ALBUTEROL SULFATE HFA 108 (90 BASE) MCG/ACT IN AERS: 2.0000 | INHALATION_SPRAY | Freq: Once | RESPIRATORY_TRACT | Status: DC | PRN

## 2019-02-27 MED ORDER — SODIUM CHLORIDE 0.9 % IV SOLN
100.0000 mg | Freq: Once | INTRAVENOUS | Status: AC
  Administered 2019-02-27: 10:00:00 100 mg via INTRAVENOUS
  Filled 2019-02-27: qty 20

## 2019-02-27 MED ORDER — DIPHENHYDRAMINE HCL 50 MG/ML IJ SOLN: 50.0000 mg | Freq: Once | INTRAMUSCULAR | Status: DC | PRN

## 2019-02-27 NOTE — Progress Notes (Signed)
  Diagnosis: COVID-19  Physician: Wright   Procedure: [facility] Med: remdesivir infusion.  Complications: No immediate complications noted.  Discharge: Discharged home   Aydden Cumpian E 02/27/2019  

## 2019-02-28 LAB — CULTURE, BLOOD (ROUTINE X 2)
Culture: NO GROWTH
Culture: NO GROWTH
Special Requests: ADEQUATE
Special Requests: ADEQUATE

## 2019-04-01 ENCOUNTER — Other Ambulatory Visit: Payer: Self-pay

## 2022-01-18 IMAGING — DX DG CHEST 1V PORT
1 series · 1 of 1 positions shown · non-contrast
Comparison: February 18, 2019.

CLINICAL DATA: Shortness of breath, 67BGL-6T positive.

EXAM:
PORTABLE CHEST 1 VIEW

[chest ap]
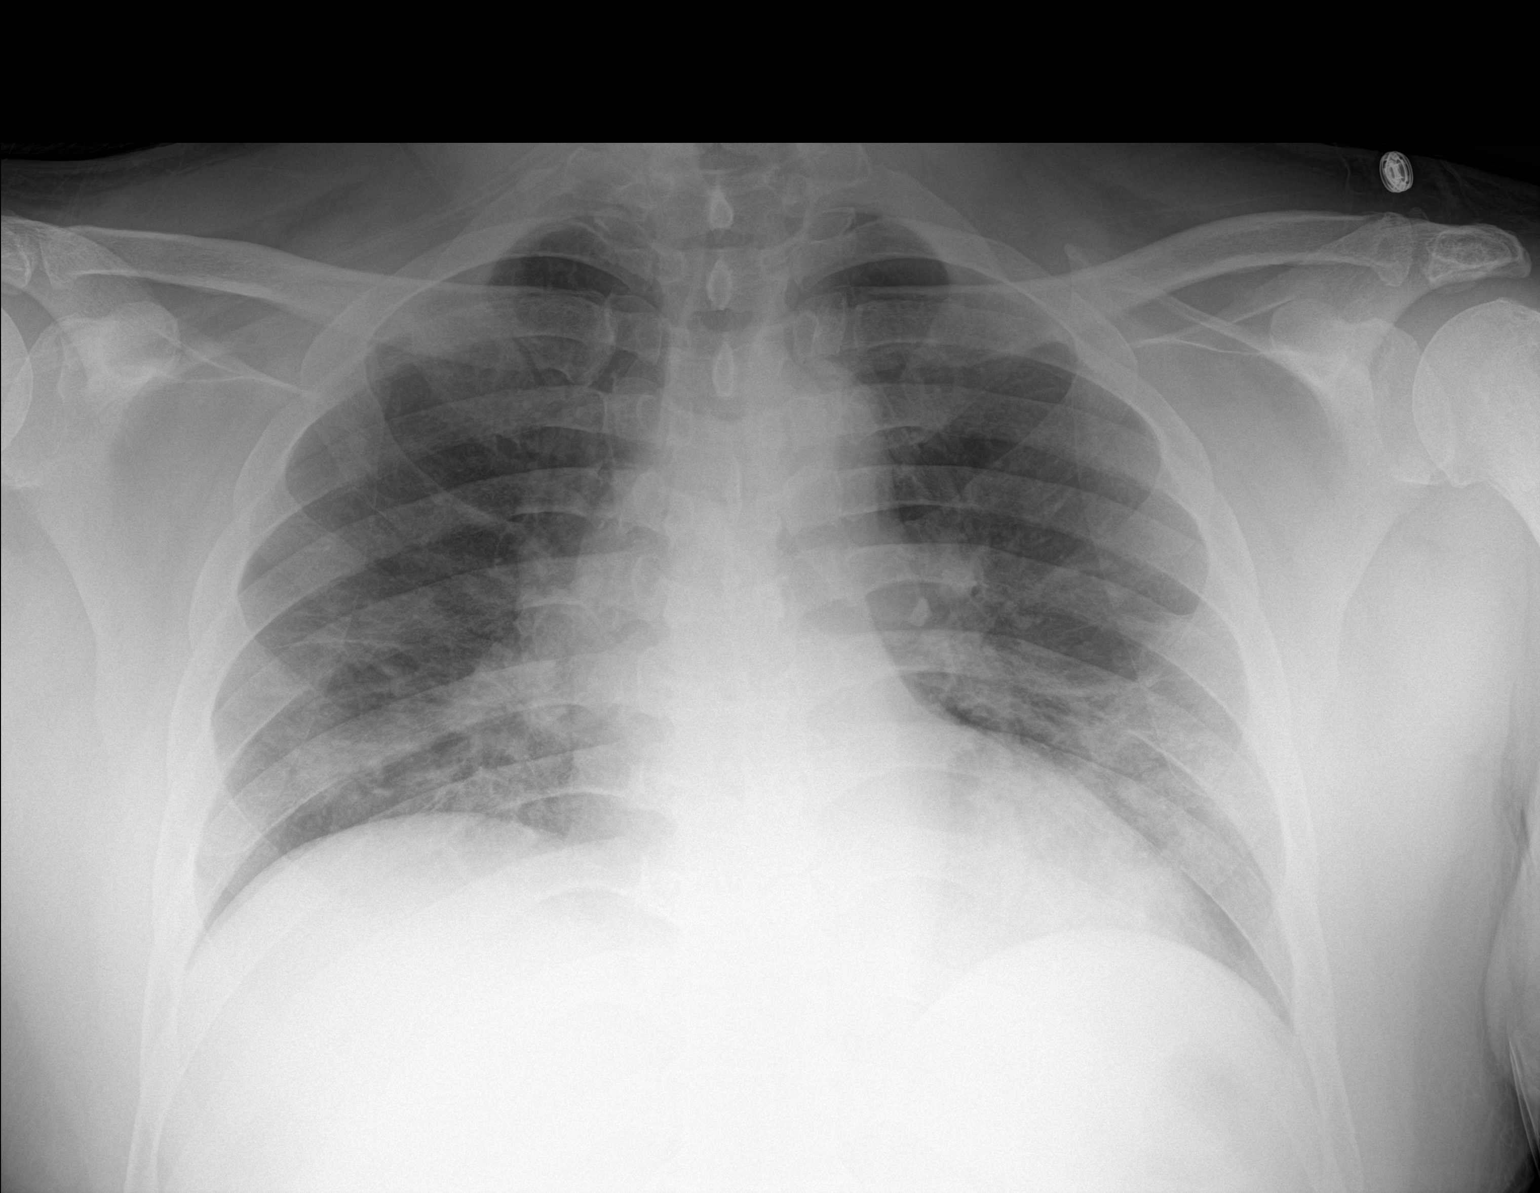

[1 of 1 positions shown; findings below may reference images not displayed]

FINDINGS: The heart size and mediastinal contours are within normal limits. No
pneumothorax or pleural effusion is noted. Bilateral lower lobe
airspace opacities are noted consistent with multifocal pneumonia.
The visualized skeletal structures are unremarkable.
IMPRESSION: Bilateral lower lobe airspace opacities are noted consistent with
multifocal pneumonia.

## 2022-03-10 ENCOUNTER — Emergency Department (HOSPITAL_COMMUNITY): Payer: Medicare Other

## 2022-03-10 ENCOUNTER — Emergency Department (HOSPITAL_COMMUNITY)
Admission: EM | Admit: 2022-03-10 | Discharge: 2022-03-10 | Disposition: A | Discharge status: Home / Self Care | Payer: Medicare Other | Attending: Emergency Medicine | Admitting: Emergency Medicine

## 2022-03-10 ENCOUNTER — Other Ambulatory Visit: Payer: Self-pay

## 2022-03-10 DIAGNOSIS — Z79899 Other long term (current) drug therapy: Secondary | ICD-10-CM | POA: Insufficient documentation

## 2022-03-10 DIAGNOSIS — R059 Cough, unspecified: Secondary | ICD-10-CM | POA: Diagnosis present

## 2022-03-10 DIAGNOSIS — I10 Essential (primary) hypertension: Secondary | ICD-10-CM | POA: Diagnosis not present

## 2022-03-10 DIAGNOSIS — J0100 Acute maxillary sinusitis, unspecified: Secondary | ICD-10-CM | POA: Insufficient documentation

## 2022-03-10 DIAGNOSIS — Z20822 Contact with and (suspected) exposure to covid-19: Secondary | ICD-10-CM | POA: Diagnosis not present

## 2022-03-10 LAB — RESP PANEL BY RT-PCR (RSV, FLU A&B, COVID)  RVPGX2
Influenza A by PCR: NEGATIVE
Influenza B by PCR: NEGATIVE
Resp Syncytial Virus by PCR: NEGATIVE
SARS Coronavirus 2 by RT PCR: NEGATIVE

## 2022-03-10 MED ORDER — AMOXICILLIN-POT CLAVULANATE 875-125 MG PO TABS: 1.0000 | ORAL_TABLET | Freq: Two times a day (BID) | ORAL | 0 refills | Status: AC

## 2022-03-10 NOTE — ED Provider Notes (Signed)
[facility] Provider Note   CSN: [id number] Arrival date & time: 03/10/22  1204     History  Chief Complaint  Patient presents with   Cough    Sean Keller is a 67 y.o. male with past medical history hypertension, hyperlipidemia, seasonal allergies who presents to the ED complaining of a dry cough and facial pain for the last month with loss of sense of taste and smell since yesterday.  He reports that he was evaluated at urgent care for this and prescribed prednisone and promethazine but had no relief with these medications.  He states that he has no prior history of a chronic cough.  He denies history of asthma, COPD, tobacco use, or other chronic lung disease.  He denies chest pain, shortness of breath, hemoptysis, sore throat, fever, chills, nausea, vomiting, or any other complaints at this time.      Home Medications Prior to Admission medications   Medication Sig Start Date End Date Taking? Authorizing Provider  amoxicillin-clavulanate (AUGMENTIN) 875-125 MG tablet Take 1 tablet by mouth 2 (two) times daily for 10 days. 03/10/22 03/20/22 Yes Isadore Bokhari L, PA-C  fluticasone (FLONASE) 50 MCG/ACT nasal spray USE TWO SPRAYS IN EACH NOSTRIL EVERY DAY AS NEEDED Patient taking differently: Place 2 sprays into both nostrils daily as needed for allergies or rhinitis.  05/11/14   Darlyne Russian, MD  levothyroxine (SYNTHROID) 50 MCG tablet Take 50 mcg by mouth daily. 01/03/19   [provider]  losartan (COZAAR) 50 MG tablet Take 50 mg by mouth daily. 02/10/19   [provider]  Multiple Vitamin (MULTIVITAMINS PO) Take 1 tablet by mouth daily.     [provider]  pravastatin (PRAVACHOL) 20 MG tablet TAKE 1 TABLET BY MOUTH EVERY NIGHT Patient taking differently: Take 20 mg by mouth daily.  07/07/15   Darlyne Russian, MD  VIAGRA 100 MG tablet TAKE 1 TABLET BY MOUTH EVERY DAY AS NEEDED Patient taking differently: Take  100 mg by mouth as needed for erectile dysfunction.  08/04/14   Jaynee Eagles, PA-C  Vitamin D, Ergocalciferol, (DRISDOL) 1.25 MG (50000 UNIT) CAPS capsule Take 50,000 Units by mouth once a week. 01/06/19   [provider]      Allergies    Patient has no known allergies.    Review of Systems   Review of Systems  All other systems reviewed and are negative.   Physical Exam Updated Vital Signs BP (!) 158/111 (BP Location: Left Arm)   Pulse (!) 112   Temp 98.7 F (37.1 C) (Oral)   Resp 18   Ht '5\' 10"'$  (1.778 m)   Wt 102.1 kg   SpO2 100%   BMI 32.28 kg/m  Physical Exam Vitals and nursing note reviewed.  Constitutional:      General: He is not in acute distress.    Appearance: Normal appearance. He is not toxic-appearing.  HENT:     Head: Normocephalic and atraumatic.     Nose: Congestion present.     Right Sinus: Maxillary sinus tenderness present.     Left Sinus: Maxillary sinus tenderness present.     Mouth/Throat:     Mouth: Mucous membranes are moist.     Pharynx: Oropharynx is clear. Uvula midline. No oropharyngeal exudate, posterior oropharyngeal erythema or uvula swelling.     Tonsils: No tonsillar exudate or tonsillar abscesses. 1+ on the right. 1+ on the left.  Eyes:     General:  Right eye: No discharge.        Left eye: No discharge.     Conjunctiva/sclera: Conjunctivae normal.  Cardiovascular:     Rate and Rhythm: Normal rate and regular rhythm.     Heart sounds: No murmur heard. Pulmonary:     Effort: Pulmonary effort is normal. No respiratory distress.     Breath sounds: Normal breath sounds. No stridor. No wheezing, rhonchi or rales.  Abdominal:     General: Abdomen is flat.     Palpations: Abdomen is soft.     Tenderness: There is no abdominal tenderness.  Musculoskeletal:        General: Normal range of motion.     Cervical back: Normal range of motion and neck supple. No rigidity.     Right lower leg: No edema.     Left lower leg: No  edema.  Lymphadenopathy:     Cervical: No cervical adenopathy.  Skin:    General: Skin is warm and dry.     Capillary Refill: Capillary refill takes less than 2 seconds.  Neurological:     Mental Status: He is alert. Mental status is at baseline.  Psychiatric:        Behavior: Behavior normal.     ED Results / Procedures / Treatments   Labs (all labs ordered are listed, but only abnormal results are displayed) Labs Reviewed  RESP PANEL BY RT-PCR (RSV, FLU A&B, COVID)  [id number]    EKG None  Radiology DG Chest 2 View  Result Date: 03/10/2022 CLINICAL DATA:  Cough EXAM: CHEST - 2 VIEW COMPARISON:  CXR 02/23/19 FINDINGS: No pleural effusion. No pneumothorax. No focal airspace opacity. Improved aeration of the bilateral lung bases compared to prior exam. Normal cardiac and mediastinal contours. Radiographically apparent displaced rib fractures. Visualized upper abdomen is unremarkable. Vertebral body heights are maintained. IMPRESSION: Improved aeration of the bilateral lung bases compared to prior exam. No focal airspace opacity. Electronically Signed   By: Marin Roberts M.D.   On: 03/10/2022 13:21    Procedures Procedures    Medications Ordered in ED Medications - No data to display  ED Course/ Medical Decision Making/ A&P                             Medical Decision Making Amount and/or Complexity of Data Reviewed Labs: ordered. Decision-making details documented in ED Course. Radiology: ordered. Decision-making details documented in ED Course.  Risk Prescription drug management.   Medical Decision Making:   Sean Keller is a 67 y.o. male who presented to the ED today with cough and facial pain as detailed above.     Complete initial physical exam performed, notably the patient  was with lungs clear to auscultation but tenderness over the bilateral maxillary sinuses.    Reviewed and confirmed nursing documentation for past medical history, family history, social  history.    Initial Assessment:   With the patient's presentation of cough, most likely diagnosis is sinusitis. Other diagnoses were considered including (but not limited to) bronchitis, pneumonia, COVID, RSV, influenza, seasonal allergies, reflux, medication side effect, other viral syndrome. These are considered less likely due to history of present illness and physical exam findings.   This is most consistent with an acute complicated illness  Initial Plan:  Viral swabs CXR to evaluate for structural/infectious intrathoracic pathology.  EKG to evaluate for cardiac pathology Objective evaluation as reviewed   Initial Study Results:  Laboratory  All laboratory results reviewed without evidence of clinically relevant pathology.     Radiology:  All images reviewed independently. Agree with radiology report at this time.   DG Chest 2 View  Result Date: 03/10/2022 CLINICAL DATA:  Cough EXAM: CHEST - 2 VIEW COMPARISON:  CXR 02/23/19 FINDINGS: No pleural effusion. No pneumothorax. No focal airspace opacity. Improved aeration of the bilateral lung bases compared to prior exam. Normal cardiac and mediastinal contours. Radiographically apparent displaced rib fractures. Visualized upper abdomen is unremarkable. Vertebral body heights are maintained. IMPRESSION: Improved aeration of the bilateral lung bases compared to prior exam. No focal airspace opacity. Electronically Signed   By: Marin Roberts M.D.   On: 03/10/2022 13:21     Final Assessment and Plan:   This is a 67 year old male presenting to the ED for month-long course of cough with onset of facial pain, loss of taste, and loss of smell recently.  No known sick contacts.  No history of lung disease and he does not smoke.  On exam, his lungs are clear to auscultation and he is in no acute distress but he does have tenderness over the bilateral maxillary sinuses.  Viral swabs negative for COVID, RSV, and influenza.  Chest x-ray negative for  pneumonia.  With history and physical exam findings and lack of improvement on medications previously prescribed by urgent care which include promethazine and prednisone, I do suspect acute bacterial sinusitis.  Discussed findings with patient and plan to discharge home on antibiotics for further treatment.  Recommended close outpatient follow-up with PCP for reevaluation.  Strict ED return precautions given, all questions answered, and patient stable for discharge.   Clinical Impression:  1. Acute non-recurrent maxillary sinusitis      Discharge           Final Clinical Impression(s) / ED Diagnoses Final diagnoses:  Acute non-recurrent maxillary sinusitis    Rx / DC Orders ED Discharge Orders          Ordered    amoxicillin-clavulanate (AUGMENTIN) 875-125 MG tablet  2 times daily        03/10/22 1443              Suzzette Righter, PA-C 03/10/22 1452    Dorie Rank, MD 03/12/22 [phone]

## 2022-03-10 NOTE — Discharge Instructions (Signed)
Thank you for letting us take care of you today.  The swabs for COVID, flu, RSV are negative and your chest x-ray did not show pneumonia or other infection.  With the description of her symptoms, I will treat you for a sinus infection with 10 days of antibiotics.  Please take these as prescribed.  Please follow-up with your PCP next week to discuss your ED visit today, if you should continue the antibiotics, and if any of her medications may be contributing to your chronic cough.  If you develop worsening symptoms or other emergent concerns, please return to the nearest emergency department for reevaluation.

## 2022-03-10 NOTE — ED Triage Notes (Signed)
Pt presents to ED from home C/O cough and congestion X 1 month. Reports yesterday lost sense of taste and smell. Endorses chest pain with coughing. Denies SOB.

## 2022-04-07 LAB — LAB REPORT - SCANNED
A1c: 6.4
EGFR (African American): 73
Free T4: 1.04 ng/dL
HM HIV Screening: NEGATIVE
PSA, Total: 1.06
PTH: 39.6
TSH: 0.85 (ref 0.41–5.90)

## 2022-08-11 LAB — LAB REPORT - SCANNED
A1c: 6.2
EGFR (African American): 83
Free T4: 0.92 ng/dL
PTH: 50.6
TSH: 0.88 (ref 0.41–5.90)

## 2022-11-21 LAB — LAB REPORT - SCANNED
A1c: 6.1
EGFR (African American): 72
Free T4: 1.23 ng/dL
PTH: 55
TSH: 1.11 (ref 0.41–5.90)

## 2023-04-30 ENCOUNTER — Ambulatory Visit (INDEPENDENT_AMBULATORY_CARE_PROVIDER_SITE_OTHER)

## 2023-04-30 ENCOUNTER — Encounter: Payer: Self-pay | Admitting: Internal Medicine

## 2023-04-30 ENCOUNTER — Ambulatory Visit: Payer: Medicare Other | Attending: Internal Medicine | Admitting: Internal Medicine

## 2023-04-30 ENCOUNTER — Ambulatory Visit

## 2023-04-30 VITALS — BP 148/89 | HR 71 | Resp 14 | Ht 70.25 in | Wt 216.0 lb

## 2023-04-30 DIAGNOSIS — M79641 Pain in right hand: Secondary | ICD-10-CM

## 2023-04-30 DIAGNOSIS — M79642 Pain in left hand: Secondary | ICD-10-CM

## 2023-04-30 DIAGNOSIS — R768 Other specified abnormal immunological findings in serum: Secondary | ICD-10-CM

## 2023-04-30 DIAGNOSIS — M25462 Effusion, left knee: Secondary | ICD-10-CM

## 2023-04-30 DIAGNOSIS — R200 Anesthesia of skin: Secondary | ICD-10-CM | POA: Diagnosis not present

## 2023-04-30 NOTE — Progress Notes (Signed)
 Office Visit Note  Patient: Sean Keller             Date of Birth: 1955-01-25           MRN: [id number]             PCP: Trellis Fries, MD Referring: Trellis Fries, MD Visit Date: 04/30/2023  Subjective:  New Patient (Initial Visit) (Patient states he has a lot of stiffness in his left knee. Patient states his fingers, hands, and wrists and aching and swelling. )    Discussed the use of AI scribe software for clinical note transcription with the patient, who gave verbal consent to proceed.  History of Present Illness   Zackari Ruane is a 68 year old male with arthritis who presents with hand and knee pain. He was referred by Dr. Felipe Horton for evaluation of joint problems and abnormal labs with positive ANA and elevated CRP.  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 Swelling is present in both wrists, particularly in the joints, and stiffness in his fingers, especially in the morning, which resolves after five to ten minutes of activity. He experiences aching in his wrists and fingers at night, sometimes disrupting his sleep. He has not taken any medication for these symptoms yet, as they are not consistently severe. Certain activities exacerbate the pain, causing it to flare up at night.  He describes issues with his left knee, which swelled up and required fluid drainage and a cortisone injection about two years ago. The knee has not swelled since the last drainage, but it remains stiff and cannot bend beyond ninety degrees, affecting his ability to walk up and down stairs without hesitation. The knee becomes stiffer with prolonged physical activity, but it does not ache constantly. He has not had any specific joint injuries or fractures.  He experiences numbness and tingling in his fingers, which has progressed over the past five years, leading  to occasional dropping of objects, particularly with his right hand. The tingling is persistent but varies in intensity, sometimes requiring him to adjust his grip while driving. He has a history of carpal tunnel syndrome diagnosed over twenty-five years ago, for which he was given a wrist brace. He has not had any nerve tests done for this condition.  He mentions having slightly abnormal blood tests in the past, including elevated C-reactive protein levels, a marker for inflammation. He has not had recent x-rays of his hands, but x-rays of his knee were done two years ago, showing arthritis. No shortness of breath or coughing.       Activities of Daily Living:  Patient reports morning stiffness for 5-10 minutes.   Patient Reports nocturnal pain.  Difficulty dressing/grooming: Denies Difficulty climbing stairs: Reports Difficulty getting out of chair: Denies Difficulty using hands for taps, buttons, cutlery, and/or writing: Denies  Review of Systems  Constitutional:  Negative for fatigue.  HENT:  Negative for mouth sores and mouth dryness.   Eyes:  Negative for dryness.  Respiratory:  Negative for shortness of breath.   Cardiovascular:  Negative for chest pain and palpitations.  Gastrointestinal:  Negative for blood in stool, constipation and diarrhea.  Endocrine: Negative for increased urination.  Genitourinary:  Negative for involuntary urination.  Musculoskeletal:  Positive for joint pain, joint pain, joint swelling and morning stiffness. Negative for gait problem, myalgias, muscle  weakness, muscle tenderness and myalgias.  Skin:  Negative for color change, rash, hair loss and sensitivity to sunlight.  Allergic/Immunologic: Negative for susceptible to infections.  Neurological:  Negative for dizziness and headaches.  Hematological:  Negative for swollen glands.  Psychiatric/Behavioral:  Negative for depressed mood and sleep disturbance. The patient is not nervous/anxious.     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 No pertinent family history. Past Surgical History:  Procedure Laterality Date   KNEE ARTHROSCOPY Right 1984   LIPOMA EXCISION Right    shoulder   Social History   Social History Narrative   Not on file   Immunization History  Administered Date(s) Administered   Influenza, Seasonal, Injecte, Preservative Fre 01/08/2012   Influenza,inj,Quad PF,6+ Mos 01/13/2013   Tdap 04/30/2006     Objective: Vital Signs: BP (!) 148/89 (BP Location: Right Arm, Patient Position: Sitting, Cuff Size: Large)   Pulse 71   Resp 14   Ht 5' 10.25" (1.784 m)   Wt 216 lb (98 kg)   BMI 30.77 kg/m    Physical Exam Eyes:     Conjunctiva/sclera: Conjunctivae normal.  Cardiovascular:     Rate and Rhythm: Normal rate and regular rhythm.  Pulmonary:     Effort: Pulmonary effort is normal.     Breath sounds: Normal breath sounds.  Musculoskeletal:     Right lower leg: No edema.     Left lower leg: No edema.  Skin:    General: Skin is warm and dry.     Findings: No rash.  Neurological:     General: No focal deficit present.     Mental Status: He is alert.     Motor: No weakness.     Comments: Pain radiation with percussion over carpal tunnel  Psychiatric:        Mood and Affect: Mood normal.      Musculoskeletal Exam:  Shoulders full ROM no tenderness or swelling Elbows full ROM no tenderness or swelling Large bony prominence dorsally on radial half, decreased flexion ROM worse on  right 1st CMC squaring without focal tenderness to pressure, chronic MCP widening and stiffness with full extension but no swelling or tenderness Fingers full ROM no tenderness or swelling Left knee moderate effusion present, crepitus, decreased flexion ROM and painful   Investigation: No additional findings.  Imaging: XR KNEE 3 VIEW LEFT Result Date: 04/30/2023 X-ray left knee 3 views There is lateral compartment joint space narrowing with marginal osteophytes.  Medial compartment space appears well-preserved.  Patellofemoral compartment with appears normal but with bone spurring worst with superior osteophyte.  Calcification medial to the joint.  Moderate joint effusion present. Impression Moderately severe lateral compartment and patellofemoral compartment osteoarthritis, appears to have tendon calcification on medial border possibly remote injury, joint effusion without erosive changes  XR Hand 2 View Left Result Date: 04/30/2023 X-ray left hand 2 views Severe radiocarpal joint space narrowing with extensive bone spurring and subchondral cyst throughout the carpal bones.  Cystic changes at metacarpal heads without joint space narrowing.  First IP osteophytes and some enthesophyte or periosteal reaction at the second proximal phalanx.  Otherwise mild to moderate degenerative changes worst at the second joint.  No erosions or abnormal calcifications seen. Impression Severe radiocarpal joint osteoarthritis, with mild to moderate osteoarthritis of the joints worst at second digit  XR Hand 2 View Right Result Date: 04/30/2023 X-ray right hand 2 views Radiocarpal joint space severe narrowing with extensive bone spurring and subchondral cysts throughout the carpal bones. Several cystic changes in metacarpal heads as well but MCP PIP and DIP joint spaces fairly well-preserved.  No erosions or abnormal calcifications seen. Impression Severe radiocarpal joint osteoarthritis, cystic changes present  throughout carpal bones without demineralization   Recent Labs: Lab Results  Component Value Date   WBC 11.9 (H) 02/25/2019   HGB 13.9 02/25/2019   PLT 307 02/25/2019   NA 138 02/26/2019   K 4.7 02/26/2019   CL 108 02/26/2019   CO2 24 02/26/2019   GLUCOSE 108 (H) 02/26/2019   BUN 27 (H) 02/26/2019   CREATININE 1.34 (H) 02/26/2019   BILITOT 0.4 02/26/2019   ALKPHOS 47 02/26/2019   AST 43 (H) 02/26/2019   ALT 69 (H) 02/26/2019   PROT 6.9 02/26/2019   ALBUMIN 3.0 (L) 02/26/2019   CALCIUM 8.4 (L) 02/26/2019   GFRAA >60 02/26/2019    Speciality Comments: No specialty comments available.  Procedures:  No procedures performed Allergies: Patient has no known allergies.   Assessment / Plan:     Visit Diagnoses: Positive ANA (antinuclear antibody) - Plan: Rheumatoid factor, Cyclic citrul peptide antibody, IgG, Sedimentation rate, C-reactive protein, ANA, Sjogrens syndrome-A extractable nuclear antibody, C3 and C4, Anti-DNA antibody, double-stranded Effusion, left knee - Plan: XR KNEE 3 VIEW LEFT Chronic arthritis in wrists, fingers, and left knee with joint swelling, stiffness, and pain. Limited knee flexion and difficulty with stairs noted. Previous x-rays confirmed arthritis. Last knee intervention two years ago. - Order x-rays of hands and left knee. - Perform blood tests for inflammation markers. - Consider joint fluid analysis if symptoms persist and blood tests are inconclusive.  Bilateral hand pain - Plan: XR Hand 2 View Right, XR Hand 2 View Left Bilateral finger numbness Carpal Tunnel Syndrome Suspected carpal tunnel syndrome with symptoms of tingling, numbness, and dropping objects, especially in the right hand. Symptoms persistent for over five years. Previous wrist brace treatment over 25 years ago. - Consider nerve conduction studies to evaluate for carpal tunnel syndrome.    Orders: Orders Placed This Encounter  Procedures   XR Hand 2 View Right   XR Hand 2 View  Left   XR KNEE 3 VIEW LEFT   Rheumatoid factor   Cyclic citrul peptide antibody, IgG   Sedimentation rate   C-reactive protein   ANA   Sjogrens syndrome-A extractable nuclear antibody   C3 and C4   Anti-DNA antibody, double-stranded   No orders of the defined types were placed in this encounter.    Follow-Up Instructions: Return in about 3 months (around 07/30/2023) for New pt OA/?ANA/?RA f/u 3mos.   Matt Song, MD  Note - This record has been created using [employer].  Chart creation errors have been sought, but may not always  have been located. Such creation errors do not reflect on  the standard of medical care.

## 2023-05-03 LAB — C3 AND C4
C3 Complement: 95 mg/dL (ref 82–185)
C4 Complement: 31 mg/dL (ref 15–53)

## 2023-05-03 LAB — RHEUMATOID FACTOR: Rheumatoid fact SerPl-aCnc: <10 [IU]/mL (ref <14)

## 2023-05-03 LAB — C-REACTIVE PROTEIN: CRP: 5.2 mg/L (ref <8.0)

## 2023-05-03 LAB — CYCLIC CITRUL PEPTIDE ANTIBODY, IGG: Cyclic Citrullin Peptide Ab: <16 U

## 2023-05-03 LAB — ANTI-DNA ANTIBODY, DOUBLE-STRANDED: ds DNA Ab: <1 [IU]/mL

## 2023-05-03 LAB — SJOGRENS SYNDROME-A EXTRACTABLE NUCLEAR ANTIBODY: SSA (Ro) (ENA) Antibody, IgG: <1 AI

## 2023-05-03 LAB — SEDIMENTATION RATE: Sed Rate: 2 mm/h (ref 0–20)

## 2023-05-03 LAB — ANA: Anti Nuclear Antibody (ANA): NEGATIVE

## 2023-07-31 ENCOUNTER — Encounter: Payer: Self-pay | Admitting: Internal Medicine

## 2023-07-31 ENCOUNTER — Ambulatory Visit: Attending: Internal Medicine | Admitting: Internal Medicine

## 2023-07-31 VITALS — BP 117/75 | HR 66 | Resp 14 | Ht 70.0 in | Wt 223.0 lb

## 2023-07-31 DIAGNOSIS — M79642 Pain in left hand: Secondary | ICD-10-CM

## 2023-07-31 DIAGNOSIS — M25462 Effusion, left knee: Secondary | ICD-10-CM

## 2023-07-31 DIAGNOSIS — M79641 Pain in right hand: Secondary | ICD-10-CM

## 2023-07-31 LAB — SYNOVIAL FLUID ANALYSIS, COMPLETE
Basophils, %: 0 %
Eosinophils-Synovial: 0 % (ref 0–2)
Lymphocytes-Synovial Fld: 69 % (ref 0–74)
Monocyte/Macrophage: 25 % (ref 0–69)
Neutrophil, Synovial: 5 % (ref 0–24)
Synoviocytes, %: 1 % (ref 0–15)
WBC, Synovial: 254 {cells}/uL — ABNORMAL HIGH (ref <150)

## 2023-07-31 NOTE — Progress Notes (Unsigned)
 Office Visit Note  Patient: Sean Keller             Date of Birth: 10-11-1955           MRN: [id number]             PCP: Joshua Francisco, MD Referring: Joshua Francisco, MD Visit Date: 07/31/2023   Subjective:  Follow-up   Discussed the use of AI scribe software for clinical note transcription with the patient, who gave verbal consent to proceed.  History of Present Illness   Sean Keller is a 68 year old male with osteoarthritis who presents with ongoing hand numbness and pain and knee swelling and pain. Workup at initial visit  He has been experiencing knee swelling that has not improved. He recalls a similar episode over two years ago when fluid was aspirated from his knee and he received a cortisone injection at orthopedic clinic, which provided relief for at least several months. Currently, his knee feels swollen.  He has a history of osteoarthritis, particularly affecting his hands and knees. He has had x-rays of his hands and knee, and he has a long history of using hand tools. 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  Blood tests have been conducted and were negative for markers of rheumatoid arthritis. No other symptoms such as fever or systemic issues.      Previous HPI 04/30/23 Sean Keller is a 68 year old male with arthritis who presents with hand and knee pain. He was referred by Dr. Neil Joshua for evaluation of joint problems and abnormal labs with positive ANA and elevated CRP.   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 Swelling is present in both wrists, particularly in the joints, and stiffness in his fingers, especially in the morning, which resolves after five to ten minutes of activity. He experiences aching in his wrists and fingers at night, sometimes disrupting his sleep. He has not taken any medication  for these symptoms yet, as they are not consistently severe. Certain activities exacerbate the pain, causing it to flare up at night.   He describes issues with his left knee, which swelled up and required fluid drainage and a cortisone injection about two years ago. The knee has not swelled since the last drainage, but it remains stiff and cannot bend beyond ninety degrees, affecting his ability to walk up and down stairs without hesitation. The knee becomes stiffer with prolonged physical activity, but it does not ache constantly. He has not had any specific joint injuries or fractures.   He experiences numbness and tingling in his fingers, which has progressed over the past five years, leading to occasional dropping of objects, particularly with his right hand. The tingling is persistent but varies in intensity, sometimes requiring him to adjust his grip while driving. He has a history of carpal tunnel syndrome diagnosed over twenty-five years ago, for which he was given a wrist brace. He has not had any nerve tests done for this condition.   He mentions having slightly abnormal blood tests in the past, including elevated C-reactive protein levels, a marker for inflammation. He has not had recent x-rays of his hands, but x-rays of his knee were done two years ago, showing arthritis. No shortness of breath or coughing.    Review of Systems  Constitutional:  Negative for  fatigue.  HENT:  Negative for mouth sores and mouth dryness.   Eyes:  Negative for dryness.  Respiratory:  Negative for shortness of breath.   Cardiovascular:  Negative for chest pain and palpitations.  Gastrointestinal:  Negative for blood in stool, constipation and diarrhea.  Endocrine: Negative for increased urination.  Genitourinary:  Negative for involuntary urination.  Musculoskeletal:  Positive for joint pain, joint pain, joint swelling and morning stiffness. Negative for gait problem, myalgias, muscle weakness, muscle  tenderness and myalgias.  Skin:  Negative for color change, rash, hair loss and sensitivity to sunlight.  Allergic/Immunologic: Negative for susceptible to infections.  Neurological:  Negative for dizziness and headaches.  Hematological:  Negative for swollen glands.  Psychiatric/Behavioral:  Negative for depressed mood and sleep disturbance. The patient is not nervous/anxious.     PMFS History:  Patient Active Problem List   Diagnosis Date Noted   Bilateral hand pain 04/30/2023   Effusion, left knee 04/30/2023   Bilateral finger numbness 04/30/2023   Hypothyroidism 02/26/2019   Benign hypertension with CKD (chronic kidney disease), stage II    Obesity (BMI 30-39.9)    Hyperlipidemia 01/13/2013   Low testosterone  01/13/2013   Seborrhea capitis 01/13/2013    Past Medical History:  Diagnosis Date   Allergy    Arthritis    Hypercholesteremia    Hypertension     History reviewed. No pertinent family history. Past Surgical History:  Procedure Laterality Date   KNEE ARTHROSCOPY Right 1984   LIPOMA EXCISION Right    shoulder   Social History   Social History Narrative   Not on file   Immunization History  Administered Date(s) Administered   Influenza, Seasonal, Injecte, Preservative Fre 01/08/2012   Influenza,inj,Quad PF,6+ Mos 01/13/2013   Tdap 04/30/2006     Objective: Vital Signs: BP 117/75 (BP Location: Left Arm, Patient Position: Sitting, Cuff Size: Large)   Pulse 66   Resp 14   Ht 5' 10 (1.778 m)   Wt 223 lb (101.2 kg)   BMI 32.00 kg/m    Physical Exam Eyes:     Conjunctiva/sclera: Conjunctivae normal.  Cardiovascular:     Rate and Rhythm: Normal rate and regular rhythm.  Pulmonary:     Effort: Pulmonary effort is normal.     Breath sounds: Normal breath sounds.  Lymphadenopathy:     Cervical: No cervical adenopathy.  Skin:    General: Skin is warm and dry.  Neurological:     Mental Status: He is alert.     Comments: Tingling in fingers but  sensation overall intact Pain with percussion over carpal tunnel  Psychiatric:        Mood and Affect: Mood normal.      Musculoskeletal Exam:  Shoulders full ROM no tenderness or swelling Elbows full ROM no tenderness or swelling Large bony prominence dorsally on radial half, decreased flexion ROM worse on right 1st CMC squaring without focal tenderness to pressure, thump IP joint bony nodule no pain or swelling Fingers full ROM no tenderness or swelling Left knee moderate effusion present, no tenderness to pressure, decreased flexion ROM and painful  Investigation: No additional findings.  Imaging: No results found.  Recent Labs: Lab Results  Component Value Date   WBC 11.9 (H) 02/25/2019   HGB 13.9 02/25/2019   PLT 307 02/25/2019   NA 138 02/26/2019   K 4.7 02/26/2019   CL 108 02/26/2019   CO2 24 02/26/2019   GLUCOSE 108 (H) 02/26/2019   BUN 27 (H)  02/26/2019   CREATININE 1.34 (H) 02/26/2019   BILITOT 0.4 02/26/2019   ALKPHOS 47 02/26/2019   AST 43 (H) 02/26/2019   ALT 69 (H) 02/26/2019   PROT 6.9 02/26/2019   ALBUMIN 3.0 (L) 02/26/2019   CALCIUM 8.4 (L) 02/26/2019   GFRAA 72 11/21/2022    Speciality Comments: No specialty comments available.  Procedures:  Large Joint Inj: L knee on 07/31/2023 11:10 AM Indications: pain, joint swelling and diagnostic evaluation Details: 22 G 1.5 in needle, superolateral approach Medications: 2 mL lidocaine  1 %; 40 mg triamcinolone  acetonide 40 MG/ML Aspirate: 25 mL yellow and clear; sent for lab analysis Outcome: tolerated well, no immediate complications Procedure, treatment alternatives, risks and benefits explained, specific risks discussed. Consent was given by the patient. Immediately prior to procedure a time out was called to verify the correct patient, procedure, equipment, support staff and site/side marked as required. Patient was prepped and draped in the usual sterile fashion.     Allergies: Patient has no known  allergies.   Assessment / Plan:     Visit Diagnoses: No diagnosis found.  ***  Orders: No orders of the defined types were placed in this encounter.  No orders of the defined types were placed in this encounter.    Follow-Up Instructions: No follow-ups on file.   Lonni LELON Ester, MD  Note - This record has been created using [employer].  Chart creation errors have been sought, but may not always  have been located. Such creation errors do not reflect on  the standard of medical care.

## 2023-08-01 ENCOUNTER — Ambulatory Visit: Payer: Self-pay | Admitting: Internal Medicine

## 2023-08-01 DIAGNOSIS — M79642 Pain in left hand: Secondary | ICD-10-CM

## 2023-08-01 DIAGNOSIS — R200 Anesthesia of skin: Secondary | ICD-10-CM

## 2023-08-01 MED ORDER — TRIAMCINOLONE ACETONIDE 40 MG/ML IJ SUSP
40.0000 mg | INTRAMUSCULAR | Status: AC | PRN
  Administered 2023-07-31: 40 mg via INTRA_ARTICULAR

## 2023-08-01 MED ORDER — LIDOCAINE HCL 1 % IJ SOLN
2.0000 mL | INTRAMUSCULAR | Status: AC | PRN
  Administered 2023-07-31: 2 mL

## 2023-08-01 NOTE — Progress Notes (Signed)
 Fluid from the knee did not show any signs of infections, crystals, or inflammatory arthritis. This probably means the swelling is coming from wear and tear damage and instability. We can monitor how he does after this injection or if symptoms come back quickly.  For hand numbness I recommend referral for nerve conduction study for carpal tunnel syndrome vs peripheral neuropathy.

## 2023-08-29 ENCOUNTER — Ambulatory Visit: Admitting: Physical Medicine and Rehabilitation

## 2023-08-29 DIAGNOSIS — R29898 Other symptoms and signs involving the musculoskeletal system: Secondary | ICD-10-CM

## 2023-08-29 DIAGNOSIS — M79641 Pain in right hand: Secondary | ICD-10-CM | POA: Diagnosis not present

## 2023-08-29 DIAGNOSIS — R202 Paresthesia of skin: Secondary | ICD-10-CM

## 2023-08-29 DIAGNOSIS — M79642 Pain in left hand: Secondary | ICD-10-CM | POA: Diagnosis not present

## 2023-08-29 NOTE — Progress Notes (Unsigned)
 Pain Scale   Average Pain 5 Patient advising he has pain, numbness and tingling in both hands. Patient also advises that at times he has weakness. Patient is right hand dominate.        +Driver, -BT, -Dye Allergies.

## 2023-09-02 NOTE — Procedures (Unsigned)
 EMG & NCV Findings: Evaluation of the left median motor nerve showed reduced amplitude (0.1 mV) and decreased conduction velocity (Elbow-Wrist, 38 m/s).  The right median motor nerve showed prolonged distal onset latency (16.3 ms), reduced amplitude (0.1 mV), and decreased conduction velocity (Elbow-Wrist, 25 m/s).  The left median (across palm) sensory and the right median (across palm) sensory nerves showed prolonged distal peak latency (Wrist, L10.7, R6.0 ms), reduced amplitude (L1.8, R5.0 V), and prolonged distal peak latency (Palm, L6.0, R5.6 ms).  All remaining nerves (as indicated in the following tables) were within normal limits.  Left vs. Right side comparison data for the median motor nerve indicates abnormal L-R latency difference (12.2 ms) and abnormal L-R velocity difference (Elbow-Wrist, 13 m/s).    Needle evaluation of the right abductor pollicis brevis muscle showed decreased insertional activity, widespread spontaneous activity, and diminished recruitment.  All remaining muscles (as indicated in the following table) showed no evidence of electrical instability.    Impression: The above electrodiagnostic study is ABNORMAL and reveals evidence of:  a severe right median nerve entrapment at the wrist (carpal tunnel syndrome) affecting sensory and motor components. The lesion is characterized by sensory and motor demyelination with evidence of significant axonal injury.   a severe left median nerve entrapment at the wrist (carpal tunnel syndrome) affecting sensory and motor components.   There is no significant electrodiagnostic evidence of any other focal nerve entrapment, brachial plexopathy or cervical radiculopathy.    Recommendations: 1.  Follow-up with referring physician. 2.  Continue current management of symptoms. 3.  Continue use of resting splint at night-time and as needed during the day. 4.  Suggest surgical evaluation.  ___________________________ Prentice Masters  FAAPMR Board Certified, American Board of Physical Medicine and Rehabilitation    Nerve Conduction Studies Anti Sensory Summary Table   Stim Site NR Peak (ms) Norm Peak (ms) P-T Amp (V) Norm P-T Amp Site1 Site2 Delta-P (ms) Dist (cm) Vel (m/s) Norm Vel (m/s)  Left Median Acr Palm Anti Sensory (2nd Digit)  32.1C  Wrist    *10.7 <3.6 *1.8 >10 Wrist Palm 4.7 0.0    Palm    *6.0 <2.0 2.4         Right Median Acr Palm Anti Sensory (2nd Digit)  31.7C  Wrist    *6.0 <3.6 *5.0 >10 Wrist Palm 0.4 0.0    Palm    *5.6 <2.0 4.8         Right Radial Anti Sensory (Base 1st Digit)  31.8C  Wrist    2.1 <3.1 27.2  Wrist Base 1st Digit 2.1 0.0    Right Ulnar Anti Sensory (5th Digit)  32.2C  Wrist    3.5 <3.7 20.4 >15.0 Wrist 5th Digit 3.5 14.0 40 >38   Motor Summary Table   Stim Site NR Onset (ms) Norm Onset (ms) O-P Amp (mV) Norm O-P Amp Site1 Site2 Delta-0 (ms) Dist (cm) Vel (m/s) Norm Vel (m/s)  Left Median Motor (Abd Poll Brev)  32.2C  Wrist    4.1 <4.2 *0.1 >5 Elbow Wrist 6.4 24.0 *38 >50  Elbow    10.5  1.7         Right Median Motor (Abd Poll Brev)  32.1C  Wrist    *16.3 <4.2 *0.1 >5 Elbow Wrist 9.3 23.0 *25 >50  Elbow    25.6  0.2         Right Ulnar Motor (Abd Dig Min)  32.4C  Wrist    3.0 <4.2 8.7 >3  B Elbow Wrist 4.4 24.0 55 >53  B Elbow    7.4  7.9  A Elbow B Elbow 1.2 10.0 83 >53  A Elbow    8.6  7.8          EMG   Side Muscle Nerve Root Ins Act Fibs Psw Amp Dur Poly Recrt Int Bruna Comment  Right Abd Poll Brev Median C8-T1 *Decr *4+ *4+ Nml Nml 0 *Reduced Nml  no muap  Right 1stDorInt Ulnar C8-T1 Nml Nml Nml Nml Nml 0 Nml Nml   Right PronatorTeres Median C6-7 Nml Nml Nml Nml Nml 0 Nml Nml   Right Biceps Musculocut C5-6 Nml Nml Nml Nml Nml 0 Nml Nml     Nerve Conduction Studies Anti Sensory Left/Right Comparison   Stim Site L Lat (ms) R Lat (ms) L-R Lat (ms) L Amp (V) R Amp (V) L-R Amp (%) Site1 Site2 L Vel (m/s) R Vel (m/s) L-R Vel (m/s)  Median Acr Palm Anti  Sensory (2nd Digit)  32.1C  Wrist *10.7 *6.0 4.7 *1.8 *5.0 64.0 Wrist Palm     Palm *6.0 *5.6 0.4 2.4 4.8 50.0       Radial Anti Sensory (Base 1st Digit)  31.8C  Wrist  2.1   27.2  Wrist Base 1st Digit     Ulnar Anti Sensory (5th Digit)  32.2C  Wrist  3.5   20.4  Wrist 5th Digit  40    Motor Left/Right Comparison   Stim Site L Lat (ms) R Lat (ms) L-R Lat (ms) L Amp (mV) R Amp (mV) L-R Amp (%) Site1 Site2 L Vel (m/s) R Vel (m/s) L-R Vel (m/s)  Median Motor (Abd Poll Brev)  32.2C  Wrist 4.1 *16.3 *12.2 *0.1 *0.1 0.0 Elbow Wrist *38 *25 *13  Elbow 10.5 25.6 15.1 1.7 0.2 88.2       Ulnar Motor (Abd Dig Min)  32.4C  Wrist  3.0   8.7  B Elbow Wrist  55   B Elbow  7.4   7.9  A Elbow B Elbow  83   A Elbow  8.6   7.8           Waveforms:

## 2023-09-05 ENCOUNTER — Encounter: Payer: Self-pay | Admitting: Physical Medicine and Rehabilitation

## 2023-09-05 NOTE — Progress Notes (Signed)
 Sean Keller - 68 y.o. male MRN [id number]  Date of birth: 1955/09/01  Office Visit Note: Visit Date: 08/29/2023 PCP: Joshua Francisco, MD Referred by: Jeannetta Lonni ORN, MD  Subjective: Chief Complaint  Patient presents with   Right Hand - Numbness, Weakness, Pain   Left Hand - Pain, Numbness, Weakness   HPI: Sean Keller is a 68 y.o. male who comes in today at the request of Dr. Lonni Jeannetta for evaluation and management of chronic, worsening and severe pain, numbness and tingling in the Bilateral upper extremities.  Patient is Right hand dominant.  He is followed by Dr. Paulett for osteoarthritis with no evidence of rheumatoid arthritis.  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  He reports this is worsened to the point where it is fairly constant now he has real difficulty using his hands for buttoning close and manipulating objects excetra.  Sometimes he really just cannot get his hands going at all.  He has significant pain in both hands with nondermatomal pain numbness and tingling in both hands radiating up into the elbows.  He is to have more nocturnal complaints now again all told it is just becoming more chronic and severe.  He has tried and failed all manner of conservative care at this point.  No prior electrodiagnostic studies.  He has no history of diabetes or cervical spine disease.   I spent more than 30 minutes speaking face-to-face with the patient with 50% of the time in counseling and discussing coordination of care.      Review of Systems  Musculoskeletal:  Positive for joint pain.  Neurological:  Positive for tingling and focal weakness.  All other systems reviewed and are negative.  Otherwise per HPI.  Assessment & Plan: Visit Diagnoses:    ICD-10-CM   1. Paresthesia of skin  R20.2 NCV with EMG (electromyography)    2. Bilateral hand pain  M79.641    M79.642     3.  Weakness of both hands  R29.898        Plan: Impression: Clinically he clearly has a lot of osteoarthritic changes of both hands and likely fairly severe carpal tunnel syndrome.  Electrodiagnostic study performed today.  The above electrodiagnostic study is ABNORMAL and reveals evidence of:  a severe right median nerve entrapment at the wrist (carpal tunnel syndrome) affecting sensory and motor components. The lesion is characterized by sensory and motor demyelination with evidence of significant axonal injury.   a severe left median nerve entrapment at the wrist (carpal tunnel syndrome) affecting sensory and motor components.   There is no significant electrodiagnostic evidence of any other focal nerve entrapment, brachial plexopathy or cervical radiculopathy.    Recommendations: 1.  Follow-up with referring physician. 2.  Continue current management of symptoms. 3.  Continue use of resting splint at night-time and as needed during the day. 4.  Suggest surgical evaluation.  Meds & Orders: No orders of the defined types were placed in this encounter.   Orders Placed This Encounter  Procedures   NCV with EMG (electromyography)    Follow-up: Return for Lonni Jeannetta, MD.   Procedures: No procedures performed  EMG & NCV Findings: Evaluation of the left median motor nerve showed reduced amplitude (0.1 mV) and decreased conduction velocity (Elbow-Wrist, 38 m/s).  The right median motor nerve showed prolonged distal onset latency (16.3 ms), reduced amplitude (0.1 mV), and decreased conduction velocity (Elbow-Wrist,  25 m/s).  The left median (across palm) sensory and the right median (across palm) sensory nerves showed prolonged distal peak latency (Wrist, L10.7, R6.0 ms), reduced amplitude (L1.8, R5.0 V), and prolonged distal peak latency (Palm, L6.0, R5.6 ms).  All remaining nerves (as indicated in the following tables) were within normal limits.  Left vs. Right side comparison data for  the median motor nerve indicates abnormal L-R latency difference (12.2 ms) and abnormal L-R velocity difference (Elbow-Wrist, 13 m/s).    Needle evaluation of the right abductor pollicis brevis muscle showed decreased insertional activity, widespread spontaneous activity, and diminished recruitment.  All remaining muscles (as indicated in the following table) showed no evidence of electrical instability.    Impression: The above electrodiagnostic study is ABNORMAL and reveals evidence of:  a severe right median nerve entrapment at the wrist (carpal tunnel syndrome) affecting sensory and motor components. The lesion is characterized by sensory and motor demyelination with evidence of significant axonal injury.   a severe left median nerve entrapment at the wrist (carpal tunnel syndrome) affecting sensory and motor components.   There is no significant electrodiagnostic evidence of any other focal nerve entrapment, brachial plexopathy or cervical radiculopathy.    Recommendations: 1.  Follow-up with referring physician. 2.  Continue current management of symptoms. 3.  Continue use of resting splint at night-time and as needed during the day. 4.  Suggest surgical evaluation.  ___________________________ Prentice Masters FAAPMR Board Certified, American Board of Physical Medicine and Rehabilitation    Nerve Conduction Studies Anti Sensory Summary Table   Stim Site NR Peak (ms) Norm Peak (ms) P-T Amp (V) Norm P-T Amp Site1 Site2 Delta-P (ms) Dist (cm) Vel (m/s) Norm Vel (m/s)  Left Median Acr Palm Anti Sensory (2nd Digit)  32.1C  Wrist    *10.7 <3.6 *1.8 >10 Wrist Palm 4.7 0.0    Palm    *6.0 <2.0 2.4         Right Median Acr Palm Anti Sensory (2nd Digit)  31.7C  Wrist    *6.0 <3.6 *5.0 >10 Wrist Palm 0.4 0.0    Palm    *5.6 <2.0 4.8         Right Radial Anti Sensory (Base 1st Digit)  31.8C  Wrist    2.1 <3.1 27.2  Wrist Base 1st Digit 2.1 0.0    Right Ulnar Anti Sensory (5th Digit)   32.2C  Wrist    3.5 <3.7 20.4 >15.0 Wrist 5th Digit 3.5 14.0 40 >38   Motor Summary Table   Stim Site NR Onset (ms) Norm Onset (ms) O-P Amp (mV) Norm O-P Amp Site1 Site2 Delta-0 (ms) Dist (cm) Vel (m/s) Norm Vel (m/s)  Left Median Motor (Abd Poll Brev)  32.2C  Wrist    4.1 <4.2 *0.1 >5 Elbow Wrist 6.4 24.0 *38 >50  Elbow    10.5  1.7         Right Median Motor (Abd Poll Brev)  32.1C  Wrist    *16.3 <4.2 *0.1 >5 Elbow Wrist 9.3 23.0 *25 >50  Elbow    25.6  0.2         Right Ulnar Motor (Abd Dig Min)  32.4C  Wrist    3.0 <4.2 8.7 >3 B Elbow Wrist 4.4 24.0 55 >53  B Elbow    7.4  7.9  A Elbow B Elbow 1.2 10.0 83 >53  A Elbow    8.6  7.8          EMG  Side Muscle Nerve Root Ins Act Fibs Psw Amp Dur Poly Recrt Int Bruna Comment  Right Abd Poll Brev Median C8-T1 *Decr *4+ *4+ Nml Nml 0 *Reduced Nml  no muap  Right 1stDorInt Ulnar C8-T1 Nml Nml Nml Nml Nml 0 Nml Nml   Right PronatorTeres Median C6-7 Nml Nml Nml Nml Nml 0 Nml Nml   Right Biceps Musculocut C5-6 Nml Nml Nml Nml Nml 0 Nml Nml     Nerve Conduction Studies Anti Sensory Left/Right Comparison   Stim Site L Lat (ms) R Lat (ms) L-R Lat (ms) L Amp (V) R Amp (V) L-R Amp (%) Site1 Site2 L Vel (m/s) R Vel (m/s) L-R Vel (m/s)  Median Acr Palm Anti Sensory (2nd Digit)  32.1C  Wrist *10.7 *6.0 4.7 *1.8 *5.0 64.0 Wrist Palm     Palm *6.0 *5.6 0.4 2.4 4.8 50.0       Radial Anti Sensory (Base 1st Digit)  31.8C  Wrist  2.1   27.2  Wrist Base 1st Digit     Ulnar Anti Sensory (5th Digit)  32.2C  Wrist  3.5   20.4  Wrist 5th Digit  40    Motor Left/Right Comparison   Stim Site L Lat (ms) R Lat (ms) L-R Lat (ms) L Amp (mV) R Amp (mV) L-R Amp (%) Site1 Site2 L Vel (m/s) R Vel (m/s) L-R Vel (m/s)  Median Motor (Abd Poll Brev)  32.2C  Wrist 4.1 *16.3 *12.2 *0.1 *0.1 0.0 Elbow Wrist *38 *25 *13  Elbow 10.5 25.6 15.1 1.7 0.2 88.2       Ulnar Motor (Abd Dig Min)  32.4C  Wrist  3.0   8.7  B Elbow Wrist  55   B Elbow  7.4   7.9  A  Elbow B Elbow  83   A Elbow  8.6   7.8           Waveforms:                Clinical History: No specialty comments available.   He reports that he has never smoked. He has been exposed to tobacco smoke. He has never used smokeless tobacco. No results for input(s): HGBA1C, LABURIC in the last 8760 hours.  Objective:  VS:  HT:    WT:   BMI:     BP:   HR: bpm  TEMP: ( )  RESP:  Physical Exam Vitals and nursing note reviewed.  Constitutional:      General: He is not in acute distress.    Appearance: Normal appearance. He is well-developed.  HENT:     Head: Normocephalic and atraumatic.  Eyes:     Conjunctiva/sclera: Conjunctivae normal.     Pupils: Pupils are equal, round, and reactive to light.  Cardiovascular:     Rate and Rhythm: Normal rate.     Pulses: Normal pulses.     Heart sounds: Normal heart sounds.  Pulmonary:     Effort: Pulmonary effort is normal. No respiratory distress.  Musculoskeletal:        General: No tenderness.     Cervical back: Normal range of motion and neck supple. No rigidity.     Right lower leg: No edema.     Left lower leg: No edema.     Comments: Inspection reveals atrophy of the right APB and mild atrophy of the left APB but no atrophy of the bilateral FDI or hand intrinsics.  Positive trigger osteoarthritic changes of the joints.  There  is no swelling, color changes, allodynia or dystrophic changes. There is 5 out of 5 strength in the bilateral wrist extension, finger abduction and long finger flexion.  There is decreased sensation to light touch in the median nerve distribution bilaterally.   There is a positive Phalen's test bilaterally. There is a negative Hoffmann's test bilaterally.  Skin:    General: Skin is warm and dry.     Findings: No erythema or rash.  Neurological:     General: No focal deficit present.     Mental Status: He is alert and oriented to person, place, and time.     Sensory: No sensory deficit.     Motor:  No weakness or abnormal muscle tone.     Coordination: Coordination normal.     Gait: Gait normal.  Psychiatric:        Mood and Affect: Mood normal.        Behavior: Behavior normal.        Thought Content: Thought content normal.     Ortho Exam  Imaging: No results found.  Past Medical/Family/Surgical/Social History: Medications & Allergies reviewed per EMR, new medications updated. Patient Active Problem List   Diagnosis Date Noted   Bilateral hand pain 04/30/2023   Effusion, left knee 04/30/2023   Bilateral finger numbness 04/30/2023   Hypothyroidism 02/26/2019   Benign hypertension with CKD (chronic kidney disease), stage II    Obesity (BMI 30-39.9)    Hyperlipidemia 01/13/2013   Low testosterone  01/13/2013   Seborrhea capitis 01/13/2013   Past Medical History:  Diagnosis Date   Allergy    Arthritis    Hypercholesteremia    Hypertension    History reviewed. No pertinent family history. Past Surgical History:  Procedure Laterality Date   KNEE ARTHROSCOPY Right 1984   LIPOMA EXCISION Right    shoulder   Social History   Occupational History   Not on file  Tobacco Use   Smoking status: Never    Passive exposure: Past   Smokeless tobacco: Never  Vaping Use   Vaping status: Never Used  Substance and Sexual Activity   Alcohol use: No   Drug use: No   Sexual activity: Yes    Birth control/protection: None

## 2023-09-06 ENCOUNTER — Telehealth: Payer: Self-pay | Admitting: *Deleted

## 2023-09-06 DIAGNOSIS — G5603 Carpal tunnel syndrome, bilateral upper limbs: Secondary | ICD-10-CM

## 2023-09-06 DIAGNOSIS — M79641 Pain in right hand: Secondary | ICD-10-CM

## 2023-09-06 DIAGNOSIS — R200 Anesthesia of skin: Secondary | ICD-10-CM

## 2023-09-06 NOTE — Telephone Encounter (Signed)
-----   Message from Prentice Masters sent at 09/05/2023  8:29 PM EDT ----- He really needs both carpal tunnels released.  He likely would have some residual symptoms on the right and may do really well on the left but his strength would actually return quite a bit.  I would suggest referral to Gildardo Alderton, MD in our practice.

## 2023-10-01 ENCOUNTER — Encounter: Admitting: Orthopedic Surgery

## 2023-11-19 ENCOUNTER — Encounter: Payer: Self-pay | Admitting: Radiology
